# Patient Record
Sex: Male | Born: 1990 | Hispanic: Yes | Marital: Married | Smoking: Current some day smoker
Health system: Southern US, Community
[De-identification: ages and names within clinical notes are randomized; demographics above are authoritative.]

## PROBLEM LIST (undated history)

## (undated) DIAGNOSIS — G8929 Other chronic pain: Secondary | ICD-10-CM

## (undated) DIAGNOSIS — F419 Anxiety disorder, unspecified: Secondary | ICD-10-CM

## (undated) DIAGNOSIS — E079 Disorder of thyroid, unspecified: Secondary | ICD-10-CM

## (undated) DIAGNOSIS — K219 Gastro-esophageal reflux disease without esophagitis: Secondary | ICD-10-CM

## (undated) HISTORY — DX: Gastro-esophageal reflux disease without esophagitis: K21.9

---
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
## 2008-05-11 ENCOUNTER — Emergency Department: Payer: Self-pay | Admitting: Emergency Medicine

## 2008-05-12 ENCOUNTER — Emergency Department: Payer: Self-pay | Admitting: Emergency Medicine

## 2008-05-13 ENCOUNTER — Other Ambulatory Visit: Payer: Self-pay

## 2008-05-27 ENCOUNTER — Emergency Department: Payer: Self-pay | Admitting: Emergency Medicine

## 2008-11-20 ENCOUNTER — Emergency Department: Payer: Self-pay | Admitting: Emergency Medicine

## 2008-12-26 ENCOUNTER — Emergency Department: Payer: Self-pay | Admitting: Emergency Medicine

## 2009-02-08 ENCOUNTER — Emergency Department: Payer: Self-pay | Admitting: Emergency Medicine

## 2009-04-26 ENCOUNTER — Emergency Department: Payer: Self-pay | Admitting: Emergency Medicine

## 2009-12-22 ENCOUNTER — Emergency Department: Payer: Self-pay | Admitting: Emergency Medicine

## 2010-04-07 ENCOUNTER — Emergency Department: Payer: Self-pay | Admitting: Emergency Medicine

## 2010-05-30 ENCOUNTER — Emergency Department: Payer: Self-pay | Admitting: Emergency Medicine

## 2010-06-04 ENCOUNTER — Emergency Department: Payer: Self-pay | Admitting: Emergency Medicine

## 2010-06-13 ENCOUNTER — Emergency Department: Payer: Self-pay | Admitting: Emergency Medicine

## 2011-01-23 ENCOUNTER — Emergency Department: Payer: Self-pay | Admitting: Emergency Medicine

## 2011-03-08 ENCOUNTER — Emergency Department: Payer: Self-pay | Admitting: Emergency Medicine

## 2012-02-04 ENCOUNTER — Emergency Department: Payer: Self-pay | Admitting: Internal Medicine

## 2012-02-04 LAB — URINALYSIS, COMPLETE
Bacteria: NEGATIVE
Blood: NEGATIVE
Glucose,UR: NEGATIVE mg/dL (ref 0–75)
Ketone: NEGATIVE
Leukocyte Esterase: NEGATIVE
Ph: 5 (ref 4.5–8.0)
RBC,UR: NONE SEEN /HPF (ref 0–5)
Specific Gravity: 1.028 (ref 1.003–1.030)

## 2013-05-12 ENCOUNTER — Emergency Department: Payer: Self-pay | Admitting: Unknown Physician Specialty

## 2013-11-10 ENCOUNTER — Emergency Department: Payer: Self-pay | Admitting: Emergency Medicine

## 2013-11-10 LAB — URINALYSIS, COMPLETE
BACTERIA: NONE SEEN
BLOOD: NEGATIVE
Bilirubin,UR: NEGATIVE
GLUCOSE, UR: NEGATIVE mg/dL (ref 0–75)
Ketone: NEGATIVE
Leukocyte Esterase: NEGATIVE
Nitrite: NEGATIVE
Ph: 6 (ref 4.5–8.0)
Protein: NEGATIVE
Specific Gravity: 1.017 (ref 1.003–1.030)
Squamous Epithelial: 1

## 2013-11-10 LAB — GC/CHLAMYDIA PROBE AMP

## 2014-03-06 ENCOUNTER — Emergency Department: Payer: Self-pay | Admitting: Emergency Medicine

## 2014-05-26 ENCOUNTER — Emergency Department: Payer: Self-pay | Admitting: Emergency Medicine

## 2014-06-12 ENCOUNTER — Emergency Department: Payer: Self-pay | Admitting: Emergency Medicine

## 2014-11-13 ENCOUNTER — Emergency Department: Payer: Self-pay | Admitting: Emergency Medicine

## 2015-06-14 ENCOUNTER — Emergency Department
Admission: EM | Admit: 2015-06-14 | Discharge: 2015-06-15 | Disposition: A | Discharge status: Home / Self Care | Payer: No Typology Code available for payment source | Attending: Emergency Medicine | Admitting: Emergency Medicine

## 2015-06-14 ENCOUNTER — Encounter: Payer: Self-pay | Admitting: *Deleted

## 2015-06-14 ENCOUNTER — Emergency Department
Admission: EM | Admit: 2015-06-14 | Discharge: 2015-06-14 | Disposition: A | Discharge status: Home / Self Care | Payer: Self-pay

## 2015-06-14 DIAGNOSIS — Z720 Tobacco use: Secondary | ICD-10-CM | POA: Insufficient documentation

## 2015-06-14 DIAGNOSIS — K529 Noninfective gastroenteritis and colitis, unspecified: Secondary | ICD-10-CM | POA: Insufficient documentation

## 2015-06-14 DIAGNOSIS — R1031 Right lower quadrant pain: Secondary | ICD-10-CM | POA: Diagnosis present

## 2015-06-14 LAB — CBC
HEMATOCRIT: 42.5 % (ref 40.0–52.0)
HEMOGLOBIN: 14.5 g/dL (ref 13.0–18.0)
MCH: 30.5 pg (ref 26.0–34.0)
MCHC: 34 g/dL (ref 32.0–36.0)
MCV: 89.6 fL (ref 80.0–100.0)
Platelets: 193 10*3/uL (ref 150–440)
RBC: 4.75 MIL/uL (ref 4.40–5.90)
RDW: 13 % (ref 11.5–14.5)
WBC: 8.7 10*3/uL (ref 3.8–10.6)

## 2015-06-14 LAB — URINALYSIS COMPLETE WITH MICROSCOPIC (ARMC ONLY)
BACTERIA UA: NONE SEEN
Bilirubin Urine: NEGATIVE
Glucose, UA: NEGATIVE mg/dL
HGB URINE DIPSTICK: NEGATIVE
Ketones, ur: NEGATIVE mg/dL
LEUKOCYTES UA: NEGATIVE
NITRITE: NEGATIVE
PROTEIN: NEGATIVE mg/dL
SPECIFIC GRAVITY, URINE: 1.018 (ref 1.005–1.030)
pH: 6 (ref 5.0–8.0)

## 2015-06-14 LAB — COMPREHENSIVE METABOLIC PANEL
ALBUMIN: 5 g/dL (ref 3.5–5.0)
ALT: 20 U/L (ref 17–63)
ANION GAP: 9 (ref 5–15)
AST: 21 U/L (ref 15–41)
Alkaline Phosphatase: 78 U/L (ref 38–126)
BILIRUBIN TOTAL: 1.3 mg/dL — AB (ref 0.3–1.2)
BUN: 13 mg/dL (ref 6–20)
CHLORIDE: 101 mmol/L (ref 101–111)
CO2: 28 mmol/L (ref 22–32)
Calcium: 9.9 mg/dL (ref 8.9–10.3)
Creatinine, Ser: 0.92 mg/dL (ref 0.61–1.24)
GFR calc Af Amer: >60 mL/min (ref >60)
GFR calc non Af Amer: >60 mL/min (ref >60)
GLUCOSE: 109 mg/dL — AB (ref 65–99)
POTASSIUM: 4.1 mmol/L (ref 3.5–5.1)
Sodium: 138 mmol/L (ref 135–145)
TOTAL PROTEIN: 7.9 g/dL (ref 6.5–8.1)

## 2015-06-14 LAB — LIPASE, BLOOD: LIPASE: 20 U/L — AB (ref 22–51)

## 2015-06-14 MED ORDER — MORPHINE SULFATE (PF) 2 MG/ML IV SOLN
2.0000 mg | Freq: Once | INTRAVENOUS | Status: AC
  Administered 2015-06-14: 2 mg via INTRAVENOUS
  Filled 2015-06-14: qty 1

## 2015-06-14 MED ORDER — IOHEXOL 240 MG/ML SOLN
25.0000 mL | Freq: Once | INTRAMUSCULAR | Status: AC | PRN
  Administered 2015-06-14: 25 mL via ORAL

## 2015-06-14 MED ORDER — ONDANSETRON HCL 4 MG/2ML IJ SOLN
4.0000 mg | Freq: Once | INTRAMUSCULAR | Status: AC
  Administered 2015-06-14: 4 mg via INTRAVENOUS
  Filled 2015-06-14: qty 2

## 2015-06-14 NOTE — ED Notes (Signed)
Report given to Kim, RN.

## 2015-06-14 NOTE — ED Notes (Signed)
MD at bedside. 

## 2015-06-14 NOTE — ED Notes (Signed)
Pt reports "Sharp" epigastric pain since this morning, not relieved by multiple OTC meds. Pt reports several episodes of vomiting today.

## 2015-06-14 NOTE — ED Provider Notes (Signed)
[facility] Emergency Department Provider Note  ____________________________________________  Time seen: 10:45 PM  I have reviewed the triage vital signs and the nursing notes.   HISTORY  Chief Complaint Abdominal Pain     HPI Mitchell Burke is a 24 y.o. male presents with 7 out of 10 right lower quadrant/epigastric pain with onset this morning. Patient states chills however no fever. Patient denies any diarrhea.    Past medical history None There are no active problems to display for this patient.   Past surgical history None No current outpatient prescriptions on file.  Allergies No known drug allergies No family history on file.  Social History Social History  Substance Use Topics  . Smoking status: Current Every Day Smoker  . Smokeless tobacco: None  . Alcohol Use: No    Review of Systems  Constitutional: Negative for fever. Eyes: Negative for visual changes. ENT: Negative for sore throat. Cardiovascular: Negative for chest pain. Respiratory: Negative for shortness of breath. Gastrointestinal: positive for abdominal pain, vomiting. Genitourinary: Negative for dysuria. Musculoskeletal: Negative for back pain. Skin: Negative for rash. Neurological: Negative for headaches, focal weakness or numbness.   10-point ROS otherwise negative.  ____________________________________________   PHYSICAL EXAM:  VITAL SIGNS: ED Triage Vitals  Enc Vitals Group     BP 06/14/15 2005 136/81 mmHg     Pulse Rate 06/14/15 2005 72     Resp 06/14/15 2005 18     Temp 06/14/15 2005 98.3 F (36.8 C)     Temp Source 06/14/15 2005 Oral     SpO2 06/14/15 2005 100 %     Weight 06/14/15 2005 165 lb (74.844 kg)     Height 06/14/15 2005 6\' 1"  (1.854 m)     Head Cir --      Peak Flow --      Pain Score 06/14/15 2005 8     Pain Loc --      Pain Edu? --      Excl. in GC? --     Constitutional: Alert and oriented. Well appearing and in no  distress. Eyes: Conjunctivae are normal. PERRL. Normal extraocular movements. ENT   Head: Normocephalic and atraumatic.   Nose: No congestion/rhinnorhea.   Mouth/Throat: Mucous membranes are moist.   Neck: No stridor. Hematological/Lymphatic/Immunilogical: No cervical lymphadenopathy. Cardiovascular: Normal rate, regular rhythm. Normal and symmetric distal pulses are present in all extremities. No murmurs, rubs, or gallops. Respiratory: Normal respiratory effort without tachypnea nor retractions. Breath sounds are clear and equal bilaterally. No wheezes/rales/rhonchi. Gastrointestinal: right lower quadrant tenderness to palpationNo distention. There is no CVA tenderness. Genitourinary: deferred Musculoskeletal: Nontender with normal range of motion in all extremities. No joint effusions.  No lower extremity tenderness nor edema. Neurologic:  Normal speech and language. No gross focal neurologic deficits are appreciated. Speech is normal.  Skin:  Skin is warm, dry and intact. No rash noted. Psychiatric: Mood and affect are normal. Speech and behavior are normal. Patient exhibits appropriate insight and judgment.  ____________________________________________    LABS (pertinent positives/negatives)  Labs Reviewed  LIPASE, BLOOD - Abnormal; Notable for the following:    Lipase 20 (*)    All other components within normal limits  COMPREHENSIVE METABOLIC PANEL - Abnormal; Notable for the following:    Glucose, Bld 109 (*)    Total Bilirubin 1.3 (*)    All other components within normal limits  URINALYSIS COMPLETEWITH MICROSCOPIC ([facility] ONLY) - Abnormal; Notable for the following:    Color, Urine YELLOW (*)  APPearance CLEAR (*)    Squamous Epithelial / LPF 0-5 (*)    All other components within normal limits  CBC         RADIOLOGY  CT Abdomen Pelvis W Contrast (Final result) Result time: 06/15/15 01:01:26   Final result by Rad Results In Interface (06/15/15  01:01:26)   Narrative:   CLINICAL DATA: Epigastric pain with nausea and vomiting for 1 day. Right lower quadrant pain.  EXAM: CT ABDOMEN AND PELVIS WITH CONTRAST  TECHNIQUE: Multidetector CT imaging of the abdomen and pelvis was performed using the standard protocol following bolus administration of intravenous contrast.  CONTRAST: OMNIPAQUE IOHEXOL 300 MG/ML SOLN  COMPARISON: None.  FINDINGS: The included lung bases are clear.  The liver, gallbladder, spleen, pancreas, and adrenal glands are normal. The kidneys demonstrate symmetric enhancement without hydronephrosis or localizing abnormality.  Stomach is physiologically distended. There are no dilated or thickened small bowel loops. Minimal clavicle thickening of the ascending colon and hepatic flexure. No surrounding inflammatory change. Remainder the colon is normal.  The appendix is normal.  No retroperitoneal adenopathy. Abdominal aorta is normal in caliber. No free air, free fluid, or intra-abdominal fluid collection.  Bladder is physiologically distended. Prostate gland is normal in size. No pelvic adenopathy.  There are no acute or suspicious osseous abnormalities.  IMPRESSION: 1. Equivocal wall thickening involving the ascending colon hepatic flexure, may reflect minimal colitis. 2. Normal appendix. Otherwise no acute abnormality in the abdomen/pelvis.   Electronically Signed By: Rubye Oaks M.D. On: 06/15/2015 01:01        INITIAL IMPRESSION / ASSESSMENT AND PLAN / ED COURSE  Pertinent labs & imaging results that were available during my care of the patient were reviewed by me and considered in my medical decision making (see chart for details).  ----------------------------------------- 2:15 AM on 06/15/2015 -----------------------------------------  Patient states that he feels much better and is in agreement with discharge  plan.   ____________________________________________   FINAL CLINICAL IMPRESSION(S) / ED DIAGNOSES  Final diagnoses:  Colitis      Darci Current, MD 06/15/15 [phone]

## 2015-06-15 ENCOUNTER — Emergency Department: Payer: No Typology Code available for payment source

## 2015-06-15 ENCOUNTER — Encounter: Payer: Self-pay | Admitting: Radiology

## 2015-06-15 MED ORDER — IOHEXOL 300 MG/ML  SOLN
100.0000 mL | Freq: Once | INTRAMUSCULAR | Status: AC | PRN
  Administered 2015-06-15: 100 mL via INTRAVENOUS

## 2015-06-15 NOTE — Discharge Instructions (Signed)

## 2015-06-15 NOTE — ED Notes (Signed)
Patient transported to CT 

## 2016-01-21 ENCOUNTER — Emergency Department
Admission: EM | Admit: 2016-01-21 | Discharge: 2016-01-21 | Disposition: A | Discharge status: Home / Self Care | Payer: Managed Care, Other (non HMO) | Attending: Emergency Medicine | Admitting: Emergency Medicine

## 2016-01-21 DIAGNOSIS — F17200 Nicotine dependence, unspecified, uncomplicated: Secondary | ICD-10-CM | POA: Insufficient documentation

## 2016-01-21 DIAGNOSIS — T50991A Poisoning by other drugs, medicaments and biological substances, accidental (unintentional), initial encounter: Secondary | ICD-10-CM | POA: Diagnosis present

## 2016-01-21 DIAGNOSIS — T5991XA Toxic effect of unspecified gases, fumes and vapors, accidental (unintentional), initial encounter: Secondary | ICD-10-CM

## 2016-01-21 LAB — CARBOXYHEMOGLOBIN
Carboxyhemoglobin: 2.7 % (ref 1.5–9.0)
Methemoglobin: 0.9 %
O2 SAT: 71.9 %
Total oxygen content: 73.1 mL/dL

## 2016-01-21 NOTE — Discharge Instructions (Signed)
Chemical Inhalation Injury A chemical inhalation injury is an internal injury, such as lung damage, that results from breathing in fumes of a chemical or harmful substance (toxic agent). Chemical inhalation injuries most often occur:  During fires, when materials that are burned release chemicals into the environment.  During work accidents, when large quantities of toxic chemicals are spilled at [employer] or industrial sites. Chemical inhalation injuries vary in severity. An injury tends to be more severe:  The more acidic or alkaline the chemical is.  The more concentrated the substance is.  The longer you are exposed to the substance. RISK FACTORS You are at a high risk for a chemical inhalation injury if you:  Are exposed to burning materials.  Work with chemicals, solvents, or cleaners. SIGNS AND SYMPTOMS Symptoms of a chemical inhalation injury may include:  Hoarse voice.  Shortness of breath or trouble breathing.  Chest pain.  Pale or blue skin.  Mucus production.  Cough.  Weakness.  Dizziness or fainting. DIAGNOSIS Most chemical inhalation injuries can be diagnosed with a physical exam and medical history. Tests may be done to check for lung damage. They may include:  A blood oxygen level test.  A chest X-ray.  Pulmonary function tests. There are no tests to identify the specific chemical or substance that caused the injury. TREATMENT  There is no specific treatment for a chemical inhalation injury. Most treatment is directed at improving the ability of the lungs to deliver oxygen to the body. Time is needed for lung tissue to heal. Supportive treatment may include:  Aerosol treatments to decrease swelling in the airways.  Suctioning of the airways to remove excess mucus.  Supplemental oxygen. HOME CARE INSTRUCTIONS  Do not use any tobacco products, including cigarettes, chewing tobacco, or electronic cigarettes. If you need help quitting, ask your  health care provider.  Do not allow yourself to be exposed to any airway irritants, such as cigarette smoke or smoke from a fireplace.  Follow your health care provider's instructions for the use of any inhalers.  Take medicines only as directed by your health care provider.  Keep all follow-up visits as directed by your health care provider. This is important. SEEK MEDICAL CARE IF:  Your symptoms are not improving as your health care provider predicted. SEEK IMMEDIATE MEDICAL CARE IF:  Your symptoms get worse.  You have increasing shortness of breath or wheezing.  Your skin or your lips appear very pale or blue.  You have a persistent cough.  You cough up blood or dark material.  You have chest pain or weakness.  You have a fever.  You faint.   This information is not intended to replace advice given to you by your health care provider. Make sure you discuss any questions you have with your health care provider.   Document Released: 05/22/2014 Document Reviewed: 05/22/2014 Elsevier Interactive Patient Education 2016 [employer].  Poisoning Information, Adult Poisoning is illness caused by eating, drinking, touching, or inhaling a harmful substance. The damaging effects on the person's health will vary depending on the type of poison, the amount of exposure, and the duration of exposure before treatment. These effects may range from mild to very severe or even fatal.  Most poisonings take place in the home and involve common household products. They can also occur in the workplace, especially in industrial or manufacturing facilities. Poisoning is more common in children than adults. However, poisoning often causes more serious illness in adults. Poisonings are often accidental,  but there are also many cases in which a person intentionally ingests poison. WHAT THINGS MAY BE POISONOUS?  A poison can be any substance that causes illness or harm to the body. Poisoning is often  caused by products that are commonly found in homes. Many substances can become poisonous if used in ways or amounts that are not appropriate. Some common products that can cause poisoning are:   Medicines, including prescription medicines, over-the-counter pain medicines, vitamins, iron pills, and herbal supplements.  Cleaning or laundry products.  Paint and paint thinner.  Weed or insect killers.  Perfume, hair spray, or nail products.  Alcohol.  Plants, such as philodendron, poinsettia, oleander, castor bean, cactus, and tomato plants.  Batteries.  Furniture polish.  Drain cleaners.  Antifreeze or other automotive products.  Gasoline, lighter fluid, or lamp oil.  Carbon monoxide gas from furnaces or automobiles.  Toxic fumes from the burning of plastics or certain other materials. WHAT ARE SOME FIRST-AID MEASURES FOR POISONING? The local poison control center must be contacted whenever a person may have been exposed to poison. The poison control specialist will often give a set of directions to follow over the phone. These directions may include the following:  Remove any substance that is still in the mouth if the poison was not food or medicine. Drink a small amount of water.  Keep the medicine container if too much medicine or the wrong medicine was swallowed. Use it to identify the medicine to the poison control specialist.  Get away from the area where exposure occurred as soon as possible if the poison was from fumes or chemicals.  Get fresh air as soon as possible if a poison was inhaled.  Remove any affected clothing and rinse the skin with water if a poison got on the skin.  Rinse the eyes with water if a poison or chemical got in the eyes.  Begin cardiopulmonary resuscitation (CPR) if breathing stops. HOW CAN YOU PREVENT POISONING? Take these steps to help prevent poisoning:  Keep medicines and chemical products in their original containers. Many of  these come in child-safe packaging. Store them in areas out of reach of children.  Educate othersabout the dangers of possible poisons.  Read labels before using medicine or household products. Leave the original labels on the containers.  Always turn on a light when taking medicine. Check the dosage every time.   Close the containers tightly after using medicine or chemical products.  Get rid of unneeded and outdated medicines by following the specific disposal instructions on the medicine label or the patient information that came with the medicine. Do not put medicine in the trash or flush it down the toilet. Use the community's drug take-back program to dispose of medicine. If these options are not available, take the medicine out of the original container and mix it with an undesirable substance, such as coffee grounds or kitty litter. Seal the mixture in a sealable bag, can, or other container and throw it away.  Keep all dangerous household products (such as lighter fluid, paint thinner and remover, gasoline, and antifreeze) in locked cabinets.  Do not mix different household chemicals with each other.  Use protective equipment (gloves, goggles, masks, aprons) as needed when using chemicals or cleaners.  Install a carbon monoxide detector in your home. WHEN SHOULD YOU SEEK HELP?  Contact the poison control center wheneveryou suspect that a person has been exposed to poison. Call [phone] [phone] (in the [place] to reach a poison center  for your area. If you are outside the [place] ask your health care provider what the phone number is for your local poison control center. Keep the phone number posted near your phone. Make sure everyone in your household knows where to find the number. The local emergency services (911 in [place] must be contacted if a person has been exposed to poison and:   Has trouble breathing or stops breathing.  Develops chest pain.  Has trouble staying awake or  becomes unconscious.  Has a seizure.  Has severe vomiting or bleeding.  Has a worsening headache.  Has a decreased level of alertness.  Develops a widespread rash that may or may not be painful.  Has changes in vision.  Has difficulty swallowing.  Develops severe abdominal pain. FOR MORE INFORMATION  American Association of Poison Control Centers: www.aapcc.org   This information is not intended to replace advice given to you by your health care provider. Make sure you discuss any questions you have with your health care provider.   Document Released: 09/04/2012 Document Revised: 02/02/2015 Document Reviewed: 09/04/2012 Elsevier Interactive Patient Education [employer] Elsevier Inc.

## 2016-01-21 NOTE — ED Provider Notes (Signed)
[facility] Emergency Department Provider Note  ____________________________________________  Time seen: 5:20 PM  I have reviewed the triage vital signs and the nursing notes.   HISTORY  Chief Complaint Toxic Inhalation    HPI Mitchell Burke is a 25 y.o. 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 He believes it was a carbon dioxide canister. Afterward he felt somewhat short of breath chest tightness with headache. He presents to the symptoms have all now resolved but he just feels fatigued. No other complaints. Denies any medical history.     No past medical history on file.  None There are no active problems to display for this patient.    No past surgical history on file. None  No current outpatient prescriptions on file. None  Allergies Review of patient's allergies indicates no known allergies.   No family history on file.  Social History Social History  Substance Use Topics  . Smoking status: Current Every Day Smoker  . Smokeless tobacco: Not on file  . Alcohol Use: No    Review of Systems  Constitutional:   No fever or chills.  Eyes:   No vision changes.  ENT:   No sore throat. No rhinorrhea. Cardiovascular:   Brief episode as above of chest pain. Respiratory:   Episode of shortness of breath as above now resolved. Gastrointestinal:   Negative for abdominal pain, vomiting and diarrhea.  No bloody stool. Genitourinary:   Negative for dysuria or difficulty urinating. Musculoskeletal:   Negative for focal pain or swelling Neurological:   Positive for frontal headaches 10-point ROS otherwise negative.  ____________________________________________   PHYSICAL EXAM:  VITAL SIGNS: ED Triage Vitals  Enc Vitals Group     BP --      Pulse Rate 01/21/16 1516 70     Resp 01/21/16 1516 16     Temp 01/21/16 1516 99 F (37.2 C)     Temp Source 01/21/16 1516 Oral      SpO2 01/21/16 1516 100 %     Weight 01/21/16 1516 176 lb (79.833 kg)     Height 01/21/16 1516  (1.854 m)     Head Cir --      Peak Flow --      Pain Score --      Pain Loc --      Pain Edu? --      Excl. in GC? --     Vital signs reviewed, nursing assessments reviewed.   Constitutional:   Alert and oriented. Well appearing and in no distress. Eyes:   No scleral icterus. No conjunctival pallor. PERRL. EOMI ENT   Head:   Normocephalic and atraumatic.   Nose:   No congestion/rhinnorhea. No septal hematoma   Mouth/Throat:   MMM, no pharyngeal erythema. No peritonsillar mass.    Neck:   No stridor. No SubQ emphysema. No meningismus. Hematological/Lymphatic/Immunilogical:   No cervical lymphadenopathy. Cardiovascular:   RRR. Symmetric bilateral radial and DP pulses.  No murmurs.  Respiratory:   Normal respiratory effort without tachypnea nor retractions. Breath sounds are clear and equal bilaterally. No wheezes/rales/rhonchi. Gastrointestinal:   Soft and nontender. Non distended. There is no CVA tenderness.  No rebound, rigidity, or guarding. Neurologic:   Normal speech and language.  CN 2-10 normal. Motor grossly intact. Normal gait No gross focal neurologic deficits are appreciated.   ____________________________________________    LABS (pertinent positives/negatives) (all labs ordered  are listed, but only abnormal results are displayed) Labs Reviewed  CARBOXYHEMOGLOBIN   ____________________________________________   EKG  Interpreted by me  Date: 01/21/2016  Rate: 72  Rhythm: normal sinus rhythm  QRS Axis: normal  Intervals: normal  ST/T Wave abnormalities: normal  Conduction Disutrbances: none  Narrative Interpretation: unremarkable      ____________________________________________    RADIOLOGY    ____________________________________________   PROCEDURES   ____________________________________________   INITIAL IMPRESSION /  ASSESSMENT AND PLAN / ED COURSE  Pertinent labs & imaging results that were available during my care of the patient were reviewed by me and considered in my medical decision making (see chart for details).  Patient well appearing no acute distress. Currently asymptomatic except for feeling fatigued. Carboxyhemoglobin level is unremarkable. Low suspicion for other toxic inhalation. No evidence of pneumonitis or respiratory distress on exam. We'll discharge home, follow up with primary care.     ____________________________________________   FINAL CLINICAL IMPRESSION(S) / ED DIAGNOSES  Final diagnoses:  Inhalation of gaseous substance, initial encounter       Portions of this note were generated with dragon dictation software. Dictation errors may occur despite best attempts at proofreading.   Sharman CheekPhillip Miliani Deike, MD 01/21/16 [phone]

## 2016-01-21 NOTE — ED Notes (Signed)
Pt reports being exposed to carbon monoxide for 30 mins at work. Reports feeling dizziness and chest tightness.

## 2016-01-21 NOTE — ED Notes (Signed)
Pt states that he was exposed to carbon dioxide at work. PT reports SOB, chest tightness and watering eyes PTA. No sx at this time. Pt alert and oriented X4, active, cooperative, pt in NAD. RR even and unlabored, color WNL.

## 2016-01-21 NOTE — ED Notes (Signed)
MD at bedside. 

## 2016-07-19 ENCOUNTER — Emergency Department
Admission: EM | Admit: 2016-07-19 | Discharge: 2016-07-19 | Disposition: A | Discharge status: Home / Self Care | Payer: Managed Care, Other (non HMO) | Attending: Emergency Medicine | Admitting: Emergency Medicine

## 2016-07-19 ENCOUNTER — Emergency Department: Payer: Managed Care, Other (non HMO)

## 2016-07-19 ENCOUNTER — Encounter: Payer: Self-pay | Admitting: Medical Oncology

## 2016-07-19 DIAGNOSIS — R110 Nausea: Secondary | ICD-10-CM | POA: Insufficient documentation

## 2016-07-19 DIAGNOSIS — F17200 Nicotine dependence, unspecified, uncomplicated: Secondary | ICD-10-CM | POA: Insufficient documentation

## 2016-07-19 DIAGNOSIS — R42 Dizziness and giddiness: Secondary | ICD-10-CM | POA: Insufficient documentation

## 2016-07-19 DIAGNOSIS — Z5321 Procedure and treatment not carried out due to patient leaving prior to being seen by health care provider: Secondary | ICD-10-CM | POA: Insufficient documentation

## 2016-07-19 DIAGNOSIS — R0789 Other chest pain: Secondary | ICD-10-CM | POA: Diagnosis present

## 2016-07-19 HISTORY — DX: Anxiety disorder, unspecified: F41.9

## 2016-07-19 LAB — BASIC METABOLIC PANEL
ANION GAP: 10 (ref 5–15)
BUN: 12 mg/dL (ref 6–20)
CHLORIDE: 101 mmol/L (ref 101–111)
CO2: 27 mmol/L (ref 22–32)
Calcium: 9.6 mg/dL (ref 8.9–10.3)
Creatinine, Ser: 0.93 mg/dL (ref 0.61–1.24)
GFR calc Af Amer: >60 mL/min (ref >60)
GFR calc non Af Amer: >60 mL/min (ref >60)
GLUCOSE: 95 mg/dL (ref 65–99)
POTASSIUM: 3.9 mmol/L (ref 3.5–5.1)
Sodium: 138 mmol/L (ref 135–145)

## 2016-07-19 LAB — CBC
HEMATOCRIT: 45.5 % (ref 40.0–52.0)
HEMOGLOBIN: 15.5 g/dL (ref 13.0–18.0)
MCH: 30.3 pg (ref 26.0–34.0)
MCHC: 34 g/dL (ref 32.0–36.0)
MCV: 89.2 fL (ref 80.0–100.0)
Platelets: 216 10*3/uL (ref 150–440)
RBC: 5.1 MIL/uL (ref 4.40–5.90)
RDW: 13.7 % (ref 11.5–14.5)
WBC: 8.6 10*3/uL (ref 3.8–10.6)

## 2016-07-19 LAB — TROPONIN I: Troponin I: <0.03 ng/mL (ref <0.03)

## 2016-07-19 NOTE — ED Triage Notes (Signed)
Pt reports that he suddenly this am had dizziness, hot all over and nausea, since then has continued to have pressure to the center of his chest. Pt reports hx of anxiety attacks years ago and this feels similar.

## 2016-08-14 ENCOUNTER — Emergency Department: Payer: Managed Care, Other (non HMO)

## 2016-08-14 ENCOUNTER — Emergency Department
Admission: EM | Admit: 2016-08-14 | Discharge: 2016-08-14 | Disposition: A | Discharge status: Home / Self Care | Payer: Managed Care, Other (non HMO) | Attending: Emergency Medicine | Admitting: Emergency Medicine

## 2016-08-14 ENCOUNTER — Encounter: Payer: Self-pay | Admitting: Emergency Medicine

## 2016-08-14 DIAGNOSIS — F17200 Nicotine dependence, unspecified, uncomplicated: Secondary | ICD-10-CM | POA: Diagnosis not present

## 2016-08-14 DIAGNOSIS — T7840XA Allergy, unspecified, initial encounter: Secondary | ICD-10-CM | POA: Insufficient documentation

## 2016-08-14 MED ORDER — METHYLPREDNISOLONE SODIUM SUCC 125 MG IJ SOLR
125.0000 mg | Freq: Once | INTRAMUSCULAR | Status: AC
  Administered 2016-08-14: 125 mg via INTRAVENOUS
  Filled 2016-08-14: qty 2

## 2016-08-14 MED ORDER — PREDNISONE 20 MG PO TABS
60.0000 mg | ORAL_TABLET | Freq: Once | ORAL | Status: AC
  Administered 2016-08-14: 60 mg via ORAL
  Filled 2016-08-14: qty 3

## 2016-08-14 MED ORDER — FAMOTIDINE IN NACL 20-0.9 MG/50ML-% IV SOLN
20.0000 mg | Freq: Once | INTRAVENOUS | Status: AC
Start: 2016-08-14 — End: 2016-08-14
  Administered 2016-08-14: 20 mg via INTRAVENOUS
  Filled 2016-08-14: qty 50

## 2016-08-14 MED ORDER — DIPHENHYDRAMINE HCL 50 MG/ML IJ SOLN
25.0000 mg | Freq: Once | INTRAMUSCULAR | Status: AC
  Administered 2016-08-14: 25 mg via INTRAVENOUS
  Filled 2016-08-14: qty 1

## 2016-08-14 NOTE — ED Notes (Signed)
Pt denies any difficulty swallowing or swelling in throat - pt is able to speak clearly and is not having any trouble clearing his throat - respirations are even and unlabored at this time - no rash is observed on body

## 2016-08-14 NOTE — ED Provider Notes (Signed)
[facility] Emergency Department Provider Note  ____________________________________________   First MD Initiated Contact with Patient 08/14/16 [phone]     (approximate)  I have reviewed the triage vital signs and the nursing notes.   HISTORY  Chief Complaint Allergic Reaction    HPI Mitchell Burke is a 25 y.o. male patient reports she had just finished drinking his coffee and then felt something like air or some funny feeling coming up at his chest into his throat and now his throat seems like there is something hung up in it and he can't clear it. Almost feels like it is closing. Patient's voice is unchanged patient is having no trouble breathing patient had an allergic reaction before with hives and itching is not having any of that now. Patient does keep clearing his throat very often patient denies any medical problems he is not short of breath is not running a fever.   Past Medical History:  Diagnosis Date  . Anxiety     There are no active problems to display for this patient.   History reviewed. No pertinent surgical history.  Prior to Admission medications   Not on File    Allergies Patient has no known allergies.  History reviewed. No pertinent family history.  Social History Social History  Substance Use Topics  . Smoking status: Current Every Day Smoker  . Smokeless tobacco: Not on file  . Alcohol use No    Review of Systems Constitutional: No fever/chills Eyes: No visual changes. ENT: No sore throat. Cardiovascular: Denies chest pain. Respiratory: Denies shortness of breath. Gastrointestinal: No abdominal pain.  No nausea, no vomiting.  No diarrhea.  No constipation. Genitourinary: Negative for dysuria. Musculoskeletal: Negative for back pain. Skin: Negative for rash. Neurological: Negative for headaches, focal weakness or numbness.  10-point ROS otherwise  negative.  ____________________________________________   PHYSICAL EXAM:  VITAL SIGNS: ED Triage Vitals [08/14/16 0839]  Enc Vitals Group     BP 126/81     Pulse Rate 63     Resp 18     Temp 98.1 F (36.7 C)     Temp Source Oral     SpO2 100 %     Weight      Height      Head Circumference      Peak Flow      Pain Score      Pain Loc      Pain Edu?      Excl. in [place]?     Constitutional: Alert and oriented. Well appearing and in no acute distress. Eyes: Conjunctivae are normal. PERRL. EOMI. Head: Atraumatic. Nose: No congestion/rhinnorhea. Mouth/Throat: Mucous membranes are moist.  Oropharynx non-erythematous. Neck: No stridor.  Cardiovascular: Normal rate, regular rhythm. Grossly normal heart sounds.  Good peripheral circulation. Respiratory: Normal respiratory effort.  No retractions. Lungs CTAB. Gastrointestinal: Soft and nontender. No distention. No abdominal bruits. No CVA tenderness. Musculoskeletal: No lower extremity tenderness nor edema.  No joint effusions. Neurologic:  Normal speech and language. No gross focal neurologic deficits are appreciated. No gait instability. Skin:  Skin is warm, dry and intact. No rash noted. Psychiatric: Mood and affect are normal. Speech Is normal.  ____________________________________________   LABS (all labs ordered are listed, but only abnormal results are displayed)  Labs Reviewed - No data to display ____________________________________________  EKG  EKG read and interpreted by me shows normal sinus rhythm rate of 69 normal axis and ST elevation in the chest leads most  likely early repolarization. The patient has absolutely no chest discomfort at all. ____________________________________________  RADIOLOGY  Study Result   CLINICAL DATA:  Pt stated that he was drinking coffee this morning AND throat felt like it was closing up. Pt has a hx of hives but doesn't know what caused it. No hx of heart/lung  conditions.  EXAM: CHEST - 2 VIEW  COMPARISON:  07/19/2016  FINDINGS: Lungs are clear.  Heart size and mediastinal contours are within normal limits.  No effusion.  Visualized bones unremarkable.  IMPRESSION: No acute cardiopulmonary disease.   Electronically Signed   By: Corlis Leak  Hassell M.D.   On: 08/14/2016 09:54    ____________________________________________   PROCEDURES  Procedure(s) performed:   Procedures  Critical Care performed:   ____________________________________________   INITIAL IMPRESSION / ASSESSMENT AND PLAN / ED COURSE  Pertinent labs & imaging results that were available during my care of the patient were reviewed by me and considered in my medical decision making (see chart for details).    Clinical Course    Patient feels much better. I will discharge him. Give him 1 more dose of prednisone now. I'm not sure that this was an allergic reaction.  ____________________________________________   FINAL CLINICAL IMPRESSION(S) / ED DIAGNOSES  Final diagnoses:  Allergic reaction, initial encounter   Actual diagnosis possible allergic reaction   NEW MEDICATIONS STARTED DURING THIS VISIT:  New Prescriptions   No medications on file     Note:  This document was prepared using Dragon voice recognition software and may include unintentional dictation errors.    [name], MD 08/14/16 1332

## 2016-08-14 NOTE — ED Notes (Signed)
Patient was driving and this started out of no where. Throat feels like something is stuck in throat. Is clearing throat. Voice is clear. Mucous membranes pink and wet. Able to cough.

## 2016-08-14 NOTE — ED Notes (Signed)
Patient reports that throat is better. Did not clear throat while in room.

## 2016-08-14 NOTE — ED Notes (Signed)
Patient states that about 3 year ago he had an allergic reaction that resulted in hives and breaking out. Patient does not have any visible hives on his upper trunk, arms, neck or face. Patient states all he has had this morning is coffee when asked if he ate something he was allergic too. Patient was given an epi pen 3 years ago but did not refill it.

## 2016-08-14 NOTE — Discharge Instructions (Signed)
Please return for any further symptoms or problems. She gets short of breath: 911. They can start treating immediately.

## 2016-08-14 NOTE — ED Notes (Signed)
Patient returned from x-ray placed back on monitor. Patient states he throat feels a little better. Did clear his throat when I asked him the question, did not prior to me asking. Will continue to monitor.

## 2016-08-14 NOTE — ED Triage Notes (Signed)
Reports feeling like throat is closing.  No resp distress.  Pt clearing throat often.  Skin w/d.

## 2019-08-24 ENCOUNTER — Other Ambulatory Visit: Payer: Self-pay

## 2019-08-24 ENCOUNTER — Emergency Department: Payer: No Typology Code available for payment source

## 2019-08-24 ENCOUNTER — Encounter: Payer: Self-pay | Admitting: *Deleted

## 2019-08-24 DIAGNOSIS — R109 Unspecified abdominal pain: Secondary | ICD-10-CM | POA: Insufficient documentation

## 2019-08-24 DIAGNOSIS — F17210 Nicotine dependence, cigarettes, uncomplicated: Secondary | ICD-10-CM | POA: Insufficient documentation

## 2019-08-24 DIAGNOSIS — M549 Dorsalgia, unspecified: Secondary | ICD-10-CM | POA: Insufficient documentation

## 2019-08-24 LAB — URINALYSIS, COMPLETE (UACMP) WITH MICROSCOPIC
Bacteria, UA: NONE SEEN
Bilirubin Urine: NEGATIVE
Glucose, UA: NEGATIVE mg/dL
Hgb urine dipstick: NEGATIVE
Ketones, ur: NEGATIVE mg/dL
Leukocytes,Ua: NEGATIVE
Nitrite: NEGATIVE
Protein, ur: NEGATIVE mg/dL
Specific Gravity, Urine: 1.003 — ABNORMAL LOW (ref 1.005–1.030)
pH: 6 (ref 5.0–8.0)

## 2019-08-24 LAB — COMPREHENSIVE METABOLIC PANEL
ALT: 31 U/L (ref 0–44)
AST: 26 U/L (ref 15–41)
Albumin: 4.7 g/dL (ref 3.5–5.0)
Alkaline Phosphatase: 78 U/L (ref 38–126)
Anion gap: 8 (ref 5–15)
BUN: 16 mg/dL (ref 6–20)
CO2: 28 mmol/L (ref 22–32)
Calcium: 9.6 mg/dL (ref 8.9–10.3)
Chloride: 104 mmol/L (ref 98–111)
Creatinine, Ser: 0.99 mg/dL (ref 0.61–1.24)
GFR calc Af Amer: >60 mL/min (ref >60)
GFR calc non Af Amer: >60 mL/min (ref >60)
Glucose, Bld: 159 mg/dL — ABNORMAL HIGH (ref 70–99)
Potassium: 3.6 mmol/L (ref 3.5–5.1)
Sodium: 140 mmol/L (ref 135–145)
Total Bilirubin: 0.8 mg/dL (ref 0.3–1.2)
Total Protein: 8 g/dL (ref 6.5–8.1)

## 2019-08-24 LAB — CBC
HCT: 43 % (ref 39.0–52.0)
Hemoglobin: 14.5 g/dL (ref 13.0–17.0)
MCH: 29.7 pg (ref 26.0–34.0)
MCHC: 33.7 g/dL (ref 30.0–36.0)
MCV: 87.9 fL (ref 80.0–100.0)
Platelets: 229 K/uL (ref 150–400)
RBC: 4.89 MIL/uL (ref 4.22–5.81)
RDW: 12.6 % (ref 11.5–15.5)
WBC: 7.4 K/uL (ref 4.0–10.5)
nRBC: 0 % (ref 0.0–0.2)

## 2019-08-24 LAB — LIPASE, BLOOD: Lipase: 37 U/L (ref 11–51)

## 2019-08-24 MED ORDER — SODIUM CHLORIDE 0.9% FLUSH: 3.0000 mL | Freq: Once | INTRAVENOUS | Status: DC

## 2019-08-24 NOTE — ED Triage Notes (Signed)
Pt has low back pain and right side abd pain.  Pt denies n/v/d.  Sx for 4 days   No hx kidney stones.  No injury to back   Pt alert

## 2019-08-25 ENCOUNTER — Encounter: Payer: Self-pay | Admitting: Emergency Medicine

## 2019-08-25 ENCOUNTER — Emergency Department
Admission: EM | Admit: 2019-08-25 | Discharge: 2019-08-25 | Disposition: A | Discharge status: Home / Self Care | Payer: No Typology Code available for payment source | Attending: Emergency Medicine | Admitting: Emergency Medicine

## 2019-08-25 DIAGNOSIS — R109 Unspecified abdominal pain: Secondary | ICD-10-CM

## 2019-08-25 HISTORY — DX: Other chronic pain: G89.29

## 2019-08-25 MED ORDER — IOHEXOL 300 MG/ML  SOLN
100.0000 mL | Freq: Once | INTRAMUSCULAR | Status: AC | PRN
  Administered 2019-08-25: 100 mL via INTRAVENOUS

## 2019-08-25 NOTE — ED Provider Notes (Signed)
[facility] Emergency Department Provider Note  ____________________________________________   First MD Initiated Contact with Patient 08/25/19 0403     (approximate)  I have reviewed the triage vital signs and the nursing notes.   HISTORY  Chief Complaint Abdominal Pain and Back Pain    HPI Lymon Kidney is a 28 y.o. male who is otherwise healthy with medical history as listed below who presents for evaluation of about 4 days of intermittent sharp pain in the right lower part of his abdomen that radiates down into the groin.   Nothing in particular makes it better or worse. He has no pain when he urinates and has not seen blood in his urine. He denies fever/chills, sore throat, chest pain, shortness of breath, cough, nausea, vomiting, and diarrhea. Because of the location he was concerned it might be his appendix so he came in. He also thought he felt some swelling in the right lower part of his abdomen, specifically a "pooching" above his belt line on the right side. He describes the pain as anywhere from mild to moderate. He has no history of kidney stones.        Past Medical History:  Diagnosis Date   Anxiety    Chronic pain    Suboxone used, prescribed by MD in [facility]    There are no active problems to display for this patient.   History reviewed. No pertinent surgical history.  Prior to Admission medications   Not on File    Allergies Patient has no known allergies.  History reviewed. No pertinent family history.  Social History Social History   Tobacco Use   Smoking status: Current Every Day Smoker   Smokeless tobacco: Never Used  Substance Use Topics   Alcohol use: No   Drug use: No    Comment: uses Suboxone    Review of Systems Constitutional: No fever/chills Eyes: No visual changes. ENT: No sore throat. Cardiovascular: Denies chest pain. Respiratory: Denies shortness of breath. Gastrointestinal:  Intermittent left lower quadrant pain radiating down into his groin for 4 days. No nausea, vomiting, nor diarrhea. Genitourinary: Negative for dysuria. Negative for hematuria. Musculoskeletal: Negative for neck pain.  Negative for back pain. Integumentary: Negative for rash. Neurological: Negative for headaches, focal weakness or numbness.   ____________________________________________   PHYSICAL EXAM:  VITAL SIGNS: ED Triage Vitals  Enc Vitals Group     BP 08/24/19 1838 (!) 142/76     Pulse Rate 08/24/19 1838 80     Resp 08/24/19 1838 17     Temp 08/24/19 1838 98.8 F (37.1 C)     Temp Source 08/24/19 1838 Oral     SpO2 08/24/19 1838 94 %     Weight 08/24/19 1839 86.2 kg (190 lb)     Height 08/24/19 1839 1.803 m (5\' 11" )     Head Circumference --      Peak Flow --      Pain Score 08/24/19 1853 6     Pain Loc --      Pain Edu? --      Excl. in [place]? --     Constitutional: Alert and oriented. Well-appearing and in no distress. Healthy body habitus. Eyes: Conjunctivae are normal.  Head: Atraumatic. Nose: No congestion/rhinnorhea. Mouth/Throat: Patient is wearing a mask. Neck: No stridor.  No meningeal signs.   Cardiovascular: Normal rate, regular rhythm. Good peripheral circulation. Grossly normal heart sounds. Respiratory: Normal respiratory effort.  No retractions. Gastrointestinal: Soft and nontender. No  distention. No tenderness at McBurney's point, negative Murphy sign. No rebound and no guarding. Genitourinary: No testicular pain or swelling, no inguinal swelling or tenderness to palpation. Musculoskeletal: No lower extremity tenderness nor edema. No gross deformities of extremities. Neurologic:  Normal speech and language. No gross focal neurologic deficits are appreciated.  Skin:  Skin is warm, dry and intact. Psychiatric: Mood and affect are normal. Speech and behavior are normal.  ____________________________________________   LABS (all labs ordered are listed,  but only abnormal results are displayed)  Labs Reviewed  COMPREHENSIVE METABOLIC PANEL - Abnormal; Notable for the following components:      Result Value   Glucose, Bld 159 (*)    All other components within normal limits  URINALYSIS, COMPLETE (UACMP) WITH MICROSCOPIC - Abnormal; Notable for the following components:   Color, Urine COLORLESS (*)    APPearance CLEAR (*)    Specific Gravity, Urine 1.003 (*)    All other components within normal limits  LIPASE, BLOOD  CBC   ____________________________________________  EKG  No indication for EKG ____________________________________________  RADIOLOGY I, Loleta Roseory Talor Desrosiers, personally viewed and evaluated these images (plain radiographs) as part of my medical decision making, as well as reviewing the written report by the radiologist.  ED MD interpretation: Normal CT scan of the abdomen and pelvis  Official radiology report(s): Ct Abd/pelvis W/ Iv Contrast  Result Date: 08/25/2019 CLINICAL DATA:  Nausea and vomiting with increased urinary frequency. Back pain for 4-5 days. EXAM: CT ABDOMEN AND PELVIS WITH CONTRAST TECHNIQUE: Multidetector CT imaging of the abdomen and pelvis was performed using the standard protocol following bolus administration of intravenous contrast. CONTRAST:  100mL OMNIPAQUE IOHEXOL 300 MG/ML  SOLN COMPARISON:  06/15/2015 FINDINGS: LOWER CHEST: No basilar pleural or apical pericardial effusion. HEPATOBILIARY: Normal hepatic contours. There is no intra- or extrahepatic biliary dilatation. The gallbladder is normal. PANCREAS: Normal pancreatic contours without pancreatic ductal dilatation or peripancreatic fluid collection. SPLEEN: Normal. ADRENALS/URINARY TRACT: --Adrenal glands: Normal. --Right kidney/ureter: No hydronephrosis, nephroureterolithiasis or solid renal mass. --Left kidney/ureter: No hydronephrosis, nephroureterolithiasis or solid renal mass. --Urinary bladder: Normal for degree of distention STOMACH/BOWEL:  --Stomach/Duodenum: There is no hiatal hernia. The duodenal course and caliber are normal. --Small bowel: No dilatation or inflammation. --Colon: No focal abnormality. --Appendix: Normal. VASCULAR/LYMPHATIC: Normal course and caliber of the major abdominal vessels. No abdominal or pelvic lymphadenopathy. REPRODUCTIVE: Normal prostate size with symmetric seminal vesicles. MUSCULOSKELETAL. No bony spinal canal stenosis or focal osseous abnormality. OTHER: None. IMPRESSION: No acute abdominal or pelvic abnormality. Electronically Signed   By: Deatra RobinsonKevin  Herman M.D.   On: 08/25/2019 00:17    ____________________________________________   PROCEDURES   Procedure(s) performed (including Critical Care):  Procedures   ____________________________________________   INITIAL IMPRESSION / MDM / ASSESSMENT AND PLAN / ED COURSE  As part of my medical decision making, I reviewed the following data within the electronic MEDICAL RECORD NUMBER Nursing notes reviewed and incorporated, Labs reviewed , Old chart reviewed, Notes from prior ED visits and [place] Controlled Substance Database   Differential diagnosis includes, but is not limited to, musculoskeletal strain, ureteral colic, inguinal hernia, epiploic appendagitis, mesenteric adenitis, appendicitis, diverticulitis. Patient is well-appearing in no distress, ambulating briskly and in no apparent pain. Vital signs are stable. Lab work is all within normal limits including blood work and urinalysis. No leukocytosis, normal lipase, CT scan without any evidence of acute abnormality. Patient waited just under 10 hours in the lobby but was asymptomatic when I saw him and seemed reassured  when I told him the results. He is comfortable with the plan for discharge and outpatient follow-up. I verified with him that he is already taking a PPI, ibuprofen, Tylenol, and his prescribed Suboxone and I encouraged him to continue taking his regular medications. I gave my usual and  customary return precautions.          ____________________________________________  FINAL CLINICAL IMPRESSION(S) / ED DIAGNOSES  Final diagnoses:  Right sided abdominal pain     MEDICATIONS GIVEN DURING THIS VISIT:  Medications  sodium chloride flush (NS) 0.9 % injection 3 mL (has no administration in time range)  iohexol (OMNIPAQUE) 300 MG/ML solution 100 mL (100 mLs Intravenous Contrast Given 08/25/19 0003)     ED Discharge Orders    None      *Please note:  Mitchell Burke was evaluated in Emergency Department on 08/25/2019 for the symptoms described in the history of present illness. He was evaluated in the context of the global COVID-19 pandemic, which necessitated consideration that the patient might be at risk for infection with the SARS-CoV-2 virus that causes COVID-19. Institutional protocols and algorithms that pertain to the evaluation of patients at risk for COVID-19 are in a state of rapid change based on information released by regulatory bodies including the CDC and federal and state organizations. These policies and algorithms were followed during the patient's care in the ED.  Some ED evaluations and interventions may be delayed as a result of limited staffing during the pandemic.*  Note:  This document was prepared using Dragon voice recognition software and may include unintentional dictation errors.   Loleta Rose, MD 08/25/19 [phone]

## 2019-08-25 NOTE — ED Notes (Signed)
Peripheral IV discontinued. Catheter intact. No signs of infiltration or redness. Gauze applied to IV site.   Discharge instructions reviewed with patient. Questions fielded by this RN. Patient verbalizes understanding of instructions. Patient discharged home in stable condition per forbach. No acute distress noted at time of discharge.    

## 2019-08-25 NOTE — ED Notes (Addendum)
Pt reports pain in right flank that moved towards RLQ and radiating into right groin over the last 4 days- no pain to palpation or gross swelling, pt denies hx of kidney stone but reports family hx of same  Pt denies N/V or D or hx of abdominal surgeries, c/o feeling of having "to pee after peeing"

## 2019-08-25 NOTE — Discharge Instructions (Signed)

## 2019-09-22 ENCOUNTER — Ambulatory Visit: Payer: 59 | Admitting: Occupational Medicine

## 2019-09-22 ENCOUNTER — Other Ambulatory Visit: Payer: Self-pay

## 2019-09-22 ENCOUNTER — Encounter: Payer: Self-pay | Admitting: Occupational Medicine

## 2019-09-22 VITALS — BP 146/94 | HR 79 | Temp 98.5°F | Resp 12 | Ht 73.0 in | Wt 194.0 lb

## 2019-09-22 DIAGNOSIS — F1120 Opioid dependence, uncomplicated: Secondary | ICD-10-CM

## 2019-09-22 DIAGNOSIS — Z021 Encounter for pre-employment examination: Secondary | ICD-10-CM

## 2019-09-22 DIAGNOSIS — K219 Gastro-esophageal reflux disease without esophagitis: Secondary | ICD-10-CM

## 2019-09-22 LAB — POCT URINALYSIS DIPSTICK
Bilirubin, UA: NEGATIVE
Blood, UA: NEGATIVE
Glucose, UA: NEGATIVE
Ketones, UA: NEGATIVE
Leukocytes, UA: NEGATIVE
Nitrite, UA: NEGATIVE
Protein, UA: NEGATIVE
Spec Grav, UA: 1.025 (ref 1.010–1.025)
Urobilinogen, UA: 0.2 E.U./dL
pH, UA: 6 (ref 5.0–8.0)

## 2019-09-22 MED ORDER — DEXILANT 30 MG PO CPDR: 30.0000 mg | DELAYED_RELEASE_CAPSULE | Freq: Every day | ORAL | 0 refills | Status: DC

## 2019-09-22 NOTE — Progress Notes (Signed)
Dx' d with Covid over a month ago.  States came out of quarantine 09/06/2019.  AMD

## 2019-09-22 NOTE — Addendum Note (Signed)
Addended by: Darlin Priestly on: 09/22/2019 06:07 PM   Modules accepted: Orders

## 2019-09-22 NOTE — Progress Notes (Signed)
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  Labs drawn today, results pending.  Patient with opioid dependence on agonist therapy (Suboxone) x3 years.  In [place] in 2011. Prescribed Percocet for back pain. Transitioned from Percocet, to Vicodin, to Tramadol. Attempted to discontinue Tramadol, unsuccessful. Receiving Suboxone through clinic in [place]. Gradually reducing dosage; down to 1/4 tablet once a day.  Disqualifying; NFPA 6.22.1 Discussed situation with occupational health medical director, Dr. Elaina Pattee.   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 Will gradually discontinue use of Suboxone over course of 6 months. Monthly follow-up with [facility] to discuss progress. Goal is to receive letter from prescribing clinic in [facility] that employee is no longer receiving Suboxone. Discussed plan with patient/employee; patient agrees.  Darlin Priestly, MHS, PA-C 09/22/2019

## 2019-09-23 LAB — CMP12+LP+TP+TSH+6AC+CBC/D/PLT
ALT: 49 IU/L — ABNORMAL HIGH (ref 0–44)
AST: 25 IU/L (ref 0–40)
Albumin/Globulin Ratio: 1.8 (ref 1.2–2.2)
Albumin: 4.8 g/dL (ref 4.1–5.2)
Alkaline Phosphatase: 94 IU/L (ref 39–117)
BUN/Creatinine Ratio: 15 (ref 9–20)
BUN: 15 mg/dL (ref 6–20)
Basophils Absolute: 0 10*3/uL (ref 0.0–0.2)
Basos: 0 %
Bilirubin Total: <0.2 mg/dL (ref 0.0–1.2)
Calcium: 9.6 mg/dL (ref 8.7–10.2)
Chloride: 99 mmol/L (ref 96–106)
Chol/HDL Ratio: 5.1 ratio — ABNORMAL HIGH (ref 0.0–5.0)
Cholesterol, Total: 209 mg/dL — ABNORMAL HIGH (ref 100–199)
Creatinine, Ser: 1.02 mg/dL (ref 0.76–1.27)
EOS (ABSOLUTE): 0 10*3/uL (ref 0.0–0.4)
Eos: 0 %
Estimated CHD Risk: 1.1 times avg. — ABNORMAL HIGH (ref 0.0–1.0)
Free Thyroxine Index: 1.8 (ref 1.2–4.9)
GFR calc Af Amer: 113 mL/min/{1.73_m2} (ref >59)
GFR calc non Af Amer: 98 mL/min/{1.73_m2} (ref >59)
GGT: 41 IU/L (ref 0–65)
Globulin, Total: 2.6 g/dL (ref 1.5–4.5)
HDL: 41 mg/dL (ref >39)
Hematocrit: 42.7 % (ref 37.5–51.0)
Hemoglobin: 15 g/dL (ref 13.0–17.7)
Immature Grans (Abs): 0 10*3/uL (ref 0.0–0.1)
Immature Granulocytes: 0 %
Iron: 67 ug/dL (ref 38–169)
LDH: 182 IU/L (ref 121–224)
LDL Chol Calc (NIH): 80 mg/dL (ref 0–99)
Lymphocytes Absolute: 2.6 10*3/uL (ref 0.7–3.1)
Lymphs: 34 %
MCH: 31.1 pg (ref 26.6–33.0)
MCHC: 35.1 g/dL (ref 31.5–35.7)
MCV: 88 fL (ref 79–97)
Monocytes Absolute: 0.5 10*3/uL (ref 0.1–0.9)
Monocytes: 6 %
Neutrophils Absolute: 4.7 10*3/uL (ref 1.4–7.0)
Neutrophils: 60 %
Platelets: 233 10*3/uL (ref 150–450)
RBC: 4.83 x10E6/uL (ref 4.14–5.80)
RDW: 13 % (ref 11.6–15.4)
Sodium: 139 mmol/L (ref 134–144)
T3 Uptake Ratio: 27 % (ref 24–39)
T4, Total: 6.8 ug/dL (ref 4.5–12.0)
TSH: 0.794 u[IU]/mL (ref 0.450–4.500)
Total Protein: 7.4 g/dL (ref 6.0–8.5)
Triglycerides: 548 mg/dL — ABNORMAL HIGH (ref 0–149)
Uric Acid: 6.3 mg/dL (ref 3.8–8.4)
VLDL Cholesterol Cal: 88 mg/dL — ABNORMAL HIGH (ref 5–40)
WBC: 7.9 10*3/uL (ref 3.4–10.8)

## 2019-09-24 ENCOUNTER — Telehealth: Payer: Self-pay

## 2019-09-24 NOTE — Telephone Encounter (Signed)
On 09/22/2019 Mitchell Priestly, PA-C (Interim Provider) renewed Rx for Dexilant 30 mg capsules, 1 cap po every day, #30, No Refills but there wasn't a pharmacy listed for him in Epic so the Rx printed out in the clinic.  Mitchell Burke was contacted to see what pharmacy he uses & [facility] [address].  Epic updated with info.  Dexilant 30 mg capsules, 1 cap po every day, #30, No Refills [name] NPI # [id number] Called into pharmacy.  AMD

## 2019-10-21 ENCOUNTER — Ambulatory Visit: Payer: 59 | Admitting: Occupational Medicine

## 2019-10-21 ENCOUNTER — Encounter: Payer: Self-pay | Admitting: Occupational Medicine

## 2019-10-21 ENCOUNTER — Other Ambulatory Visit: Payer: Self-pay

## 2019-10-21 VITALS — BP 144/78 | HR 79 | Temp 97.7°F | Ht 71.0 in | Wt 192.2 lb

## 2019-10-21 DIAGNOSIS — Z021 Encounter for pre-employment examination: Secondary | ICD-10-CM

## 2019-10-21 NOTE — Progress Notes (Signed)
  Subjective:     Patient ID: Mitchell Burke, male   DOB: Jul 07, 1991, 29 y.o.   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 Labs drawn today, results pending. Patient with opioid dependence on agonist therapy (Suboxone) x3 years.  In [place] in [place]. Prescribed Percocet for back pain. Transitioned from Percocet, to Vicodin, to Tramadol. Attempted to discontinue Tramadol, unsuccessful. Receiving Suboxone through clinic in [place]. Gradually reducing dosage; down to 1/4 tablet once a day. Disqualifying; NFPA 6.22.1 Discussed situation with occupational health medical director, Dr. Kathrine Haddock.  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 Will gradually discontinue use of Suboxone over course of 6 months. Monthly follow-up with [facility] to discuss progress. Goal is to receive letter from prescribing clinic in [facility] that employee is no longer receiving Suboxone. Discussed plan with patient/employee; patient agrees. Janalyn Harder, MHS, PA-C 09/22/2019  Mitchell Burke presents in follow-up today regarding tapering of Suboxone.  Tells me that he has been working full duty and has been feeling just fine.  He is still tapering and says he is taking about an 8 of an 8 mg tablet twice a day now.  He tells me that he is only used 5 pills in the last month.  His provider that is writing for his Suboxone [facility] [place].  The last visit with him was August 2020.  He says that he prefers to self taper his Suboxone medication and is  doing okay with this.  He tells me that he is having withdrawal symptoms of chills and insomnia and some loose stools and symptoms are improved compared to when he started taking less Suboxone about a month ago when we discovered this in his annual employment exam.  He tells me that he is managing insomnia with melatonin as needed with mixed results.  Takes no other medications.  I was able to verify with the PMP aware that indeed his last fill date for Suboxone 8 mg was October 24 and he was given 15 pills.  He says he has 8 or 9 pills left out of this prescription.     Review of Systems     Objective:   Physical Exam     Assessment:     Occupational Fit for Duty exam    Plan:     Overall it sounds like he is managing his tapering from Suboxone well and I applauded his dedication to discontinuing this medication.  He will follow-up with me in a month and we will continue to follow him monthly until he has completely discontinued Suboxone and is stable.  I would suggest that we follow him once a month for couple of months and then perhaps every 3 months for a couple of times and then annually after that.  We do need to remember to check [facility] aware every visit.

## 2019-11-25 ENCOUNTER — Ambulatory Visit: Payer: 59 | Admitting: Occupational Medicine

## 2019-11-25 ENCOUNTER — Other Ambulatory Visit: Payer: Self-pay

## 2019-11-25 VITALS — BP 130/88 | HR 80 | Temp 97.7°F | Ht 71.0 in | Wt 195.2 lb

## 2019-11-25 DIAGNOSIS — F1120 Opioid dependence, uncomplicated: Secondary | ICD-10-CM

## 2019-11-25 NOTE — Progress Notes (Signed)
Presents in follow-up for his tapering off Suboxone.  He has been completely off for the last 3 days and feels fine.  He was reporting to me complaints of insomnia.  He says he tries melatonin at bedtime and this works for about an hour but then he wakes up and still has insomnia.  This is been going on for quite some time.  Think that with his history of addiction that specialty sleep consultation would be appropriate.  I told him to consult with Dr. Vickey Huger at [facility] neurology.  He is doing extremely well off of the Suboxone and is having no withdrawal symptoms.  Working full duty.  Impression: Successful taper off Suboxone  Plan: Qualified for full duty.  No planned follow-up regarding this problem.

## 2019-11-27 ENCOUNTER — Ambulatory Visit: Payer: Self-pay | Admitting: Physician Assistant

## 2019-11-27 ENCOUNTER — Other Ambulatory Visit: Payer: Self-pay

## 2019-11-27 VITALS — BP 151/98 | HR 86 | Temp 97.7°F | Wt 196.0 lb

## 2019-11-27 DIAGNOSIS — L089 Local infection of the skin and subcutaneous tissue, unspecified: Secondary | ICD-10-CM

## 2019-11-27 MED ORDER — MUPIROCIN 2 % EX OINT: 1.0000 "application " | TOPICAL_OINTMENT | Freq: Every day | CUTANEOUS | 0 refills | Status: DC

## 2019-11-27 NOTE — Progress Notes (Signed)
   Subjective:    Patient ID: Mitchell Burke, male    DOB: [birth year], 29 y.o.   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      Objective:   Physical Exam Constitutional:      Appearance: Normal appearance. He is well-groomed.    Neurological:     Mental Status: He is alert.  Psychiatric:        Behavior: Behavior is cooperative.    Area cleansed- examined- no evidence of foreign body- Single hair fragment ( hairy legs) removed from small triangular 68mm wound. 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

## 2019-11-28 ENCOUNTER — Telehealth: Payer: Self-pay

## 2019-11-28 DIAGNOSIS — G4700 Insomnia, unspecified: Secondary | ICD-10-CM

## 2019-11-28 NOTE — Telephone Encounter (Signed)
Patient was seen on 11/25/19 by Dr. Alto Denver, she recommended patient to schedule an appointment with Dr.Dohmeier for insomnia. Patient called and states they informed him he will need a referral. Referral placed.

## 2019-12-01 ENCOUNTER — Ambulatory Visit: Payer: Self-pay | Admitting: Physician Assistant

## 2019-12-01 ENCOUNTER — Other Ambulatory Visit: Payer: Self-pay

## 2019-12-01 VITALS — BP 137/90 | HR 78 | Temp 98.1°F | Ht 71.0 in | Wt 196.6 lb

## 2019-12-01 DIAGNOSIS — L03115 Cellulitis of right lower limb: Secondary | ICD-10-CM

## 2019-12-01 DIAGNOSIS — L02415 Cutaneous abscess of right lower limb: Secondary | ICD-10-CM

## 2019-12-01 MED ORDER — SULFAMETHOXAZOLE-TRIMETHOPRIM 800-160 MG PO TABS: 2.0000 | ORAL_TABLET | Freq: Two times a day (BID) | ORAL | 0 refills | Status: AC

## 2019-12-01 NOTE — Progress Notes (Signed)
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 [employer]  Exam:  Ambulatory without restriction Wearing bandaid dressing  Does not have Muprirocin Rx with him  Right knee is mild/mod swollen symmetrically  Erythematous and warm to touch Initial abrasion appears healed Hairs have been abraded at base by dressing Locally warm to touch, fluctuant to touch Nothing localized Nothing draining to culture  Impression: Abscess suspected Cellulitis  Plan:   TMP-SMZ DS 2 tabs BID x 5 days Denies drug allergies  RTC 5 days - more quickly if increases Warm packs , elevate a possible (rice in white sock in M/W  Discussed) Cannot culture, cannot r/o MRSA  Continue Mupirocin 2% and knee bandaid To minimize abrading by jeans

## 2019-12-05 ENCOUNTER — Ambulatory Visit: Payer: Self-pay | Admitting: Physician Assistant

## 2019-12-05 ENCOUNTER — Other Ambulatory Visit: Payer: Self-pay

## 2019-12-05 VITALS — BP 149/83 | HR 80 | Temp 97.2°F | Wt 192.8 lb

## 2019-12-05 DIAGNOSIS — L02415 Cutaneous abscess of right lower limb: Secondary | ICD-10-CM

## 2019-12-05 NOTE — Progress Notes (Signed)
Follow up visit for abscess right knee Patient reports feels much better, discomfort dramatically better - notes that he has been cleansing it with his choice of alcohol. Has seen some pus drainage-this morning a "glob of snot" was expressed....  VSS afebrile feels well, reports mild discomfort only now  Right knee with resolving abscess No longer inflamed, swelling markedly reduced Small amount present lateral only Nothing can be expressed  Area is cleansed and re-dressed  Imp - Resolving abscess right knee  Plan - Have discussed MRSA with patient- we do not have culture to support but presentation was c/w. Reminded not to touch the area Keep band aid dressing In the future with any similar experience to inform then  provider of our suspicion at this time  Will renew Rx at half dose- thus 1 TMP-SMZ DS  BID x 5 days. Has tolerated 2 BID well to this point but can step  Down- [facility] Continue Mupirocin ointment with dressing changes  RTC Prn questions concerns or failure to completely resolve

## 2019-12-16 ENCOUNTER — Ambulatory Visit (INDEPENDENT_AMBULATORY_CARE_PROVIDER_SITE_OTHER): Payer: 59 | Admitting: Neurology

## 2019-12-16 ENCOUNTER — Other Ambulatory Visit: Payer: Self-pay

## 2019-12-16 ENCOUNTER — Encounter: Payer: Self-pay | Admitting: Neurology

## 2019-12-16 VITALS — BP 138/87 | HR 70 | Temp 97.6°F | Ht 71.0 in | Wt 192.0 lb

## 2019-12-16 DIAGNOSIS — G4701 Insomnia due to medical condition: Secondary | ICD-10-CM

## 2019-12-16 DIAGNOSIS — R252 Cramp and spasm: Secondary | ICD-10-CM

## 2019-12-16 DIAGNOSIS — T17308A Unspecified foreign body in larynx causing other injury, initial encounter: Secondary | ICD-10-CM

## 2019-12-16 DIAGNOSIS — G253 Myoclonus: Secondary | ICD-10-CM

## 2019-12-16 DIAGNOSIS — F19982 Other psychoactive substance use, unspecified with psychoactive substance-induced sleep disorder: Secondary | ICD-10-CM

## 2019-12-16 DIAGNOSIS — G478 Other sleep disorders: Secondary | ICD-10-CM

## 2019-12-16 MED ORDER — TRAZODONE HCL 50 MG PO TABS: 50.0000 mg | ORAL_TABLET | Freq: Every day | ORAL | 5 refills | Status: DC
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
## 2019-12-16 NOTE — Addendum Note (Signed)
Addended by: Melvyn Novas on: 12/16/2019 09:51 AM   Modules accepted: Orders

## 2020-01-12 ENCOUNTER — Ambulatory Visit (INDEPENDENT_AMBULATORY_CARE_PROVIDER_SITE_OTHER): Payer: 59 | Admitting: Neurology

## 2020-01-12 ENCOUNTER — Other Ambulatory Visit: Payer: Self-pay

## 2020-01-12 DIAGNOSIS — G478 Other sleep disorders: Secondary | ICD-10-CM

## 2020-01-12 DIAGNOSIS — R002 Palpitations: Secondary | ICD-10-CM

## 2020-01-12 DIAGNOSIS — G253 Myoclonus: Secondary | ICD-10-CM

## 2020-01-12 DIAGNOSIS — G4701 Insomnia due to medical condition: Secondary | ICD-10-CM

## 2020-01-12 DIAGNOSIS — T17308A Unspecified foreign body in larynx causing other injury, initial encounter: Secondary | ICD-10-CM

## 2020-01-12 DIAGNOSIS — R252 Cramp and spasm: Secondary | ICD-10-CM

## 2020-01-12 DIAGNOSIS — G4733 Obstructive sleep apnea (adult) (pediatric): Secondary | ICD-10-CM

## 2020-01-12 DIAGNOSIS — F19982 Other psychoactive substance use, unspecified with psychoactive substance-induced sleep disorder: Secondary | ICD-10-CM
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
## 2020-01-21 NOTE — Addendum Note (Signed)
Addended by: Melvyn Novas on: 01/21/2020 01:09 PM   Modules accepted: Orders

## 2020-01-21 NOTE — Progress Notes (Signed)
Summary & Diagnosis:   No clinically significant degree of sleep apnea with an AHI of  2.0/h.-and usually any AHI under 5 is not treated.  There is snoring noted, and tachy-bradycardia is clearly present-  the highly variable cardiac rate is not correlated to AHI or  hypoxic events.   Recommendations:     I recommend an attended sleep study to further investigate the  possibility of RLS/ PLMs and to evaluate the nocturnal cardiac  rhythm and not just rate. I feel certain that there is no sleep  apnea disorder.  Interpreting Physician: Melvyn Novas, MD   CC Kathrine Haddock, MD

## 2020-01-22 ENCOUNTER — Telehealth: Payer: Self-pay | Admitting: Neurology

## 2020-01-22 ENCOUNTER — Encounter: Payer: Self-pay | Admitting: Neurology

## 2020-01-22 NOTE — Telephone Encounter (Signed)
-----   Message from Melvyn Novas, MD sent at 01/21/2020  1:09 PM EDT ----- Summary & Diagnosis:   No clinically significant degree of sleep apnea with an AHI of  2.0/h.-and usually any AHI under 5 is not treated.  There is snoring noted, and tachy-bradycardia is clearly present-  the highly variable cardiac rate is not correlated to AHI or  hypoxic events.   Recommendations:     I recommend an attended sleep study to further investigate the  possibility of RLS/ PLMs and to evaluate the nocturnal cardiac  rhythm and not just rate. I feel certain that there is no sleep  apnea disorder.  Interpreting Physician: Melvyn Novas, MD   CC Kathrine Haddock, MD

## 2020-01-22 NOTE — Telephone Encounter (Signed)
Called the patient to discuss sleep study results. There was no answer. And VM box was full. Unable to lvm. I will send a mychart message as well.

## 2020-02-12 ENCOUNTER — Other Ambulatory Visit: Payer: Self-pay

## 2020-02-12 ENCOUNTER — Ambulatory Visit (INDEPENDENT_AMBULATORY_CARE_PROVIDER_SITE_OTHER): Payer: 59 | Admitting: Neurology

## 2020-02-12 DIAGNOSIS — R252 Cramp and spasm: Secondary | ICD-10-CM

## 2020-02-12 DIAGNOSIS — G478 Other sleep disorders: Secondary | ICD-10-CM

## 2020-02-12 DIAGNOSIS — G4761 Periodic limb movement disorder: Secondary | ICD-10-CM | POA: Diagnosis not present

## 2020-02-12 DIAGNOSIS — R002 Palpitations: Secondary | ICD-10-CM

## 2020-02-12 DIAGNOSIS — T17308A Unspecified foreign body in larynx causing other injury, initial encounter: Secondary | ICD-10-CM

## 2020-02-12 DIAGNOSIS — G253 Myoclonus: Secondary | ICD-10-CM

## 2020-02-18 MED ORDER — PRAMIPEXOLE DIHYDROCHLORIDE 0.125 MG PO TABS: ORAL_TABLET | ORAL | 5 refills | Status: DC

## 2020-02-18 NOTE — Addendum Note (Signed)
Addended by: Melvyn Novas on: 02/18/2020 04:57 PM   Modules accepted: Orders
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
## 2020-02-18 NOTE — Procedures (Signed)
PATIENT'S NAME:  Mitchell Burke, Mitchell Burke DOB:      [birth year]      MR#:    [id number]     DATE OF RECORDING: 02/12/2020 REFERRING M.D.:  Kathrine Haddock, MD - [facility]  Study Performed:   Polysomnogram with Parasomnia/ PLM montage HISTORY:    Mitchell Burke is a 29 year old male patient  of Hispanic origin who has a medical history of Anxiety, Chronic pain, and GERD (gastroesophageal reflux disease).  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  In 2011 he developed back pain after an injury and was unfortunately treated with opiates for a while and then able to go to Suboxone.  During the year 2020 he weaned completely off narcotic medication.  Some of his current sleep concerns may still be related to weaning off medication that by itself is sleep inducing.  His wife has noted him to twitch quite a bit at night and this could be related to back disease or radiculopathy. He also reports abnormal sensations in both lower extremities.  It is not an irresistible urge to move but a tingling and prickling sensation and it is bothersome when he tries to go to sleep or when he just woke up and tries to resume sleep.   In addition, he has noted insomnia, and has tried melatonin which has helped him to go to sleep but he feels a have no positive effect on the duration of sleep he achieved.   Again, he took sleep inducing medication for quite a while and his body may still adjust to it, however with his shift work he certainly deserves a more restorative and refreshing sleep on his days off.  The patient was screened for OSA by a HST which was negative for apnea- AHI 2/h. and higher RDI and tachy brady cardia were present. I recommended an attended sleep study to further investigate the  possibility of RLS/ PLMs and to evaluate the nocturnal cardiac rhythm and not just rate. I feel certain that there is no sleep  apnea disorder.  The patient endorsed the Epworth Sleepiness  Scale at 4 points.   The patient's weight 192 pounds with a height of 71 (inches), resulting in a BMI of 26.9 kg/m2. The patient's neck circumference measured 16.8 inches.  CURRENT MEDICATIONS: None   PROCEDURE:  This is a multichannel digital polysomnogram utilizing the Somnostar 11.2 system.  Electrodes and sensors were applied and monitored per AASM Specifications.   EEG, EOG, Chin and Limb EMG, were sampled at 200 Hz.  ECG, Snore and Nasal Pressure, Thermal Airflow, Respiratory Effort, CPAP Flow and Pressure, Oximetry was sampled at 50 Hz. Digital video and audio were recorded.      BASELINE STUDY : Lights Out was at 22:10 and Lights On at 04:30.  Total recording time (TRT) was 380 minutes, with a total sleep time (TST) of 331 minutes.  The patient's sleep latency was 17.5 minutes.  REM latency was 199 minutes.  The sleep efficiency was 87.1 %.     SLEEP ARCHITECTURE: WASO (Wake after sleep onset) was 38.5 minutes.  There were 22 minutes in Stage N1, 147 minutes Stage N2, 100 minutes Stage N3 and 62 minutes in Stage REM.  The percentage of Stage N1 was 6.6%, Stage N2 was 44.4%, Stage N3 was 30.2% and Stage R (REM sleep) was 18.7%.   RESPIRATORY ANALYSIS:  There were a total of 0 respiratory events:  0 obstructive apneas, 0  central apneas and 0 mixed apneas with a total of 0 apneas and an apnea index (AI) of 0 /hour. There were 0 hypopneas with a hypopnea index of 0 /hour. The patient also had 0 respiratory event related arousals (RERAs).      The total APNEA/HYPOPNEA INDEX (AHI) was 0/hour and the total RESPIRATORY DISTURBANCE INDEX was  0 /hour.  0 events occurred in REM sleep and 0 events in NREM. The REM AHI was  0 /hour, versus a non-REM AHI of 0. The patient spent 48.5 minutes of total sleep time in the supine position and 283 minutes in non-supine.. The supine AHI was 0.0 versus a non-supine AHI of 0.0.  OXYGEN SATURATION & C02:  The Wake baseline 02 saturation was 96%, with the lowest  being 92%. Time spent below 89% saturation equaled 0 minutes.  The arousals were noted as: 49 were spontaneous, 347 were associated with PLMs, 0 were associated with respiratory events. The patient had a total of 546 Periodic Limb Movements.  The Periodic Limb Movement (PLM) Arousal index was 62.9/hour.  Video analysis did show ongoing limb movements through NREM sleep but no complex behaviors, phonations or vocalizations.   Snoring was noted. EKG was in keeping with normal sinus rhythm (NSR). PLMs caused motion related artefact in EKG channel.    IMPRESSION:  1. SEVERE Periodic Limb Movement Disorder (PLMD), penetrating even N3 sleep, and sparing only REM sleep. The movement seems to primarily involve the left lower extremity, but is severe enough to lead to motion artefact in EKG and pulsoximetry.  2. Moderate Primary Snoring, accentuated in supine sleep. No evidence of sleep apnea.    RECOMMENDATIONS:  1. The patient has severe sleep interruptions through frequent arousals caused by PLMs. There is no REM sleep disorder as the condition spares REM sleep. This is not the manifestation of a neurodegenerative disorder. 2. Treatment with RLS medication should be started ASAP. These are non-narcotic, non-addictive medications.  3. The most likely origin for a young man is from an injury, either leading to myelopathy or radiculopathy.    I certify that I have reviewed the entire raw data recording prior to the issuance of this report in accordance with the Standards of Accreditation of the American Academy of Sleep Medicine (AASM)   Larey Seat, MD Diplomat, American Board of Psychiatry and Neurology  Diplomat, American Board of Sleep Medicine Market researcher, [facility] [employer]

## 2020-02-18 NOTE — Progress Notes (Signed)
  IMPRESSION:  1. SEVERE Periodic Limb Movement Disorder (PLMD), penetrating even N3 sleep, and sparing only REM sleep. The movement seems to primarily involve the left lower extremity, but is severe enough to lead to motion artefact in EKG and pulsoximetry.  2. Moderate Primary Snoring, accentuated in supine sleep. No evidence of sleep apnea.    RECOMMENDATIONS:  1. The patient has severe sleep interruptions through frequent arousals caused by PLMs. There is no REM sleep disorder as the condition spares REM sleep. This is not the manifestation of a neurodegenerative disorder. 2. Treatment with RLS medication should be started ASAP. These are non-narcotic, non-addictive medications.  3. The most likely origin for a young man is from an injury, either leading to myelopathy or radiculopathy manifesting this way. The unilateral distribution favors a radiculopathy.    I certify that I have reviewed the entire raw data recording prior to the issuance of this report in accordance with the Standards of Accreditation of the American Academy of Sleep Medicine (AASM)   Konstance Happel, MD 

## 2020-02-19 ENCOUNTER — Telehealth: Payer: Self-pay | Admitting: Neurology

## 2020-02-19 NOTE — Telephone Encounter (Signed)
-----   Message from Melvyn Novas, MD sent at 02/18/2020  4:57 PM EDT -----  IMPRESSION:  1. SEVERE Periodic Limb Movement Disorder (PLMD), penetrating even N3 sleep, and sparing only REM sleep. The movement seems to primarily involve the left lower extremity, but is severe enough to lead to motion artefact in EKG and pulsoximetry.  2. Moderate Primary Snoring, accentuated in supine sleep. No evidence of sleep apnea.    RECOMMENDATIONS:  1. The patient has severe sleep interruptions through frequent arousals caused by PLMs. There is no REM sleep disorder as the condition spares REM sleep. This is not the manifestation of a neurodegenerative disorder. 2. Treatment with RLS medication should be started ASAP. These are non-narcotic, non-addictive medications.  3. The most likely origin for a young man is from an injury, either leading to myelopathy or radiculopathy manifesting this way. The unilateral distribution favors a radiculopathy.    I certify that I have reviewed the entire raw data recording prior to the issuance of this report in accordance with the Standards of Accreditation of the American Academy of Sleep Medicine (AASM)   Melvyn Novas, MD

## 2020-02-19 NOTE — Telephone Encounter (Signed)
Called the patient and reviewed the in lab study with the patient. Advised the patient that Dr Vickey Huger did note there was severe PLM's noted in the study. Everything else looked good but due to the significance/severity of the PLMS she wants to get him set up on a medication to help with this. Patient has never taken anything before. Dr Dohmeier called mirapex in for the patient.  Pt currently was started on trazodone to help with falling asleep and asked if he should take both. Advised the patient that he can certainly try without taking the trazodone to see if the mirapex helps him with falling asleep. If it does then the trazodone can be dc'd. Advised if he finds still needs the trazodone they are ok to take together. Pt verbalized understanding. Scheduled the patient for a 2 month f/u to allow patient to start medication and advised apt will be to discuss SS in more detail and see how treatment plan is working. Pt verbalized understanding.

## 2020-04-12 ENCOUNTER — Ambulatory Visit: Payer: Self-pay | Admitting: Neurology

## 2020-04-27 ENCOUNTER — Ambulatory Visit: Payer: Self-pay | Admitting: Neurology

## 2020-05-25 ENCOUNTER — Ambulatory Visit: Payer: Self-pay | Admitting: Neurology

## 2020-06-10 ENCOUNTER — Encounter: Payer: Self-pay | Admitting: Neurology

## 2020-06-10 ENCOUNTER — Ambulatory Visit (INDEPENDENT_AMBULATORY_CARE_PROVIDER_SITE_OTHER): Payer: 59 | Admitting: Neurology

## 2020-06-10 ENCOUNTER — Telehealth: Payer: Self-pay | Admitting: Neurology

## 2020-06-10 VITALS — BP 127/78 | HR 77 | Ht 71.0 in | Wt 200.0 lb

## 2020-06-10 DIAGNOSIS — R208 Other disturbances of skin sensation: Secondary | ICD-10-CM

## 2020-06-10 DIAGNOSIS — T733XXD Exhaustion due to excessive exertion, subsequent encounter: Secondary | ICD-10-CM | POA: Diagnosis not present

## 2020-06-10 DIAGNOSIS — G510 Bell's palsy: Secondary | ICD-10-CM | POA: Diagnosis not present

## 2020-06-10 DIAGNOSIS — R200 Anesthesia of skin: Secondary | ICD-10-CM

## 2020-06-10 DIAGNOSIS — R252 Cramp and spasm: Secondary | ICD-10-CM

## 2020-06-10 DIAGNOSIS — F19982 Other psychoactive substance use, unspecified with psychoactive substance-induced sleep disorder: Secondary | ICD-10-CM

## 2020-06-10 MED ORDER — PRAMIPEXOLE DIHYDROCHLORIDE 0.5 MG PO TABS: 0.5000 mg | ORAL_TABLET | Freq: Every day | ORAL | 1 refills | Status: DC

## 2020-06-10 NOTE — Telephone Encounter (Signed)
[employer] order sent to GI. They will reach out to the patient to schedule.  
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
## 2020-06-10 NOTE — Patient Instructions (Signed)

## 2020-06-11 LAB — CBC WITH DIFFERENTIAL/PLATELET
Basophils Absolute: 0 10*3/uL (ref 0.0–0.2)
Basos: 1 %
EOS (ABSOLUTE): 0 10*3/uL (ref 0.0–0.4)
Eos: 0 %
Hematocrit: 45.7 % (ref 37.5–51.0)
Hemoglobin: 16.1 g/dL (ref 13.0–17.7)
Immature Grans (Abs): 0 10*3/uL (ref 0.0–0.1)
Immature Granulocytes: 0 %
Lymphocytes Absolute: 2.2 10*3/uL (ref 0.7–3.1)
Lymphs: 34 %
MCH: 31.8 pg (ref 26.6–33.0)
MCHC: 35.2 g/dL (ref 31.5–35.7)
MCV: 90 fL (ref 79–97)
Monocytes Absolute: 0.4 10*3/uL (ref 0.1–0.9)
Monocytes: 7 %
Neutrophils Absolute: 3.7 10*3/uL (ref 1.4–7.0)
Neutrophils: 58 %
Platelets: 241 10*3/uL (ref 150–450)
RBC: 5.06 x10E6/uL (ref 4.14–5.80)
RDW: 12.8 % (ref 11.6–15.4)
WBC: 6.4 10*3/uL (ref 3.4–10.8)

## 2020-06-11 LAB — COMPREHENSIVE METABOLIC PANEL
ALT: 35 IU/L (ref 0–44)
AST: 30 IU/L (ref 0–40)
Albumin/Globulin Ratio: 1.6 (ref 1.2–2.2)
Albumin: 4.9 g/dL (ref 4.1–5.2)
Alkaline Phosphatase: 83 IU/L (ref 48–121)
BUN/Creatinine Ratio: 14 (ref 9–20)
BUN: 16 mg/dL (ref 6–20)
Bilirubin Total: 0.3 mg/dL (ref 0.0–1.2)
CO2: 23 mmol/L (ref 20–29)
Calcium: 9.5 mg/dL (ref 8.7–10.2)
Chloride: 99 mmol/L (ref 96–106)
Creatinine, Ser: 1.16 mg/dL (ref 0.76–1.27)
GFR calc Af Amer: 97 mL/min/{1.73_m2} (ref >59)
GFR calc non Af Amer: 84 mL/min/{1.73_m2} (ref >59)
Globulin, Total: 3 g/dL (ref 1.5–4.5)
Glucose: 84 mg/dL (ref 65–99)
Potassium: 4 mmol/L (ref 3.5–5.2)
Sodium: 138 mmol/L (ref 134–144)
Total Protein: 7.9 g/dL (ref 6.0–8.5)

## 2020-06-11 LAB — TESTOSTERONE: Testosterone: 316 ng/dL (ref 264–916)

## 2020-06-11 LAB — TSH+FREE T4
Free T4: 1.24 ng/dL (ref 0.82–1.77)
TSH: 0.478 u[IU]/mL (ref 0.450–4.500)

## 2020-06-11 NOTE — Progress Notes (Signed)
All normal : results of metabolic panel, TSH and T4( thyroid function), CBC and differential, and Testosterone levels for 53-31 year old male.  Recommend to take vit B12 ( 100 % of rec.dose) and Vit D ( 2000 units) over the counter- if not taking these already, this may help with energy levels.

## 2020-06-14 ENCOUNTER — Telehealth: Payer: Self-pay

## 2020-06-14 NOTE — Telephone Encounter (Signed)
All normal : results of metabolic panel, TSH and T4( thyroid function), CBC and differential, and Testosterone levels for 85-31 year old male.  Recommend to take vit b12 ( 100 % of rec.dose) and Vit D ( 2000 units) over the counter- this may help with energy levels.  Written by Melvyn Novas, MD on 06/11/2020 10:19 AM EDT

## 2020-06-14 NOTE — Telephone Encounter (Signed)
Attempted to reach the pt.  Pt was not available and vm was not set up. Will try again at a later time.

## 2020-06-15 NOTE — Telephone Encounter (Addendum)
Attempted to reach the pt for the second time. Pt was not available and vm was not set up. My chart message sent with lab results included. Pt advised to call or message back if he had any questions.

## 2020-06-20 ENCOUNTER — Other Ambulatory Visit: Payer: 59

## 2020-06-21 ENCOUNTER — Telehealth: Payer: Self-pay | Admitting: Neurology

## 2020-06-21 NOTE — Telephone Encounter (Signed)
Called the patient back and he was stating that he has noticed that he has developed numbness on the left side of the body. He has also noticed this when he was on the lower dose but it has intensified since increasing the dose. He also noted that confusion as a concern.  Pt will for now hold off on this medication. I asked if he was interested in an alternative but he doesn't want to add anything additional. He is hoping that some of the side effects will die down now that he has stopped the medication. He will notify us if he changes his mind and would like to start something different.

## 2020-06-21 NOTE — Telephone Encounter (Signed)
Pt asking to be called to discuss him not taking any medications due to him feeling weird on medication. Please call pt to discuss.

## 2020-06-25 ENCOUNTER — Ambulatory Visit
Admission: RE | Admit: 2020-06-25 | Discharge: 2020-06-25 | Disposition: A | Discharge status: Home / Self Care | Payer: 59 | Source: Ambulatory Visit | Attending: Neurology | Admitting: Neurology

## 2020-06-25 ENCOUNTER — Other Ambulatory Visit: Payer: Self-pay

## 2020-06-25 DIAGNOSIS — G510 Bell's palsy: Secondary | ICD-10-CM

## 2020-06-25 MED ORDER — GADOBENATE DIMEGLUMINE 529 MG/ML IV SOLN
18.0000 mL | Freq: Once | INTRAVENOUS | Status: AC | PRN
  Administered 2020-06-25: 18 mL via INTRAVENOUS

## 2020-06-26 ENCOUNTER — Ambulatory Visit
Admission: RE | Admit: 2020-06-26 | Discharge: 2020-06-26 | Disposition: A | Discharge status: Home / Self Care | Payer: 59 | Source: Ambulatory Visit | Attending: Neurology | Admitting: Neurology

## 2020-06-26 DIAGNOSIS — R252 Cramp and spasm: Secondary | ICD-10-CM

## 2020-06-26 DIAGNOSIS — R200 Anesthesia of skin: Secondary | ICD-10-CM

## 2020-06-26 DIAGNOSIS — F19982 Other psychoactive substance use, unspecified with psychoactive substance-induced sleep disorder: Secondary | ICD-10-CM

## 2020-06-26 DIAGNOSIS — R208 Other disturbances of skin sensation: Secondary | ICD-10-CM

## 2020-06-26 MED ORDER — GADOBENATE DIMEGLUMINE 529 MG/ML IV SOLN
20.0000 mL | Freq: Once | INTRAVENOUS | Status: AC | PRN
  Administered 2020-06-26: 20 mL via INTRAVENOUS

## 2020-06-28 ENCOUNTER — Encounter: Payer: Self-pay | Admitting: Neurology

## 2020-06-28 NOTE — Progress Notes (Signed)
Patient with severe PLMs- and paresthesias.  FINDINGS:  The cervical vertebrae demonstrate slight loss of forward lordotic curvature but normal body height and marrow signal characteristics except at C6-7 where there is loss of disc height with subligamentous broad-based central disc protrusion but without significant compression.  There is mild left greater than right foraminal narrowing at C6-7 as well.  There is no other frank disc condition cord compression root or foraminal encroachment noted.  Visualized portion of the brainstem and upper cervical spine appear unremarkable.  Upper thoracic spine also showed no abnormalities.  Visualized paraspinal soft tissue appear unremarkable.  Postcontrast images do not result in abnormal areas of enhancement.    IMPRESSION: Abnormal MRI scan cervical spine showing broad-based central disc protrusion at C6-7 with mild left greater than right foraminal narrowing but no significant compression.  No enhancing lesions are noted. Dr Pearlean Brownie, MD 06-26-2020

## 2020-06-28 NOTE — Progress Notes (Signed)
IMPRESSION: Unremarkable MRI scan of the brain with and without contrast.  Incidental chronic paranasal sinus inflammatory tissue is noted especially over the sphenoid sinus and over the petrous apex on the right   INTERPRETING PHYSICIAN: Delia Heady, MD on 06-26-20.  No evidence of inflammatory brain changes, demyelination or strokes.

## 2020-06-29 ENCOUNTER — Encounter: Payer: Self-pay | Admitting: Neurology

## 2020-06-29 ENCOUNTER — Telehealth: Payer: Self-pay | Admitting: Neurology

## 2020-06-29 ENCOUNTER — Other Ambulatory Visit: Payer: Self-pay

## 2020-06-29 ENCOUNTER — Ambulatory Visit (INDEPENDENT_AMBULATORY_CARE_PROVIDER_SITE_OTHER): Payer: 59 | Admitting: Neurology

## 2020-06-29 DIAGNOSIS — R252 Cramp and spasm: Secondary | ICD-10-CM | POA: Diagnosis not present

## 2020-06-29 DIAGNOSIS — F19982 Other psychoactive substance use, unspecified with psychoactive substance-induced sleep disorder: Secondary | ICD-10-CM

## 2020-06-29 DIAGNOSIS — R208 Other disturbances of skin sensation: Secondary | ICD-10-CM

## 2020-06-29 NOTE — Telephone Encounter (Signed)
-----   Message from Melvyn Novas, MD sent at 06/28/2020 10:26 AM EDT ----- Patient with severe PLMs- and paresthesias.  FINDINGS:  The cervical vertebrae demonstrate slight loss of forward lordotic curvature but normal body height and marrow signal characteristics except at C6-7 where there is loss of disc height with subligamentous broad-based central disc protrusion but without significant compression.  There is mild left greater than right foraminal narrowing at C6-7 as well.  There is no other frank disc condition cord compression root or foraminal encroachment noted.  Visualized portion of the brainstem and upper cervical spine appear unremarkable.  Upper thoracic spine also showed no abnormalities.  Visualized paraspinal soft tissue appear unremarkable.  Postcontrast images do not result in abnormal areas of enhancement.    IMPRESSION: Abnormal MRI scan cervical spine showing broad-based central disc protrusion at C6-7 with mild left greater than right foraminal narrowing but no significant compression.  No enhancing lesions are noted. Dr Pearlean Brownie, MD 06-26-2020

## 2020-06-29 NOTE — Telephone Encounter (Signed)
Called the patient and reviewed the MRI of the cervical spine.reviewed there was slight disk protrusion with no significant nerve compression. Advised that usually we treat depending on symptoms. If symptoms are bad then we could consider referral to PT to evaluate and work with him and if worsening a referral to neurosurgery to advise if there is further intervention. Pt states at this time symptoms are not too bothersome. Advised that if they do he can always talk to his PCP about those referrals mentioned above or contact us with any issues. Recapped the brain MRI results and advised no abnormal findings on brain. Informed him to follow up with pcp on the sinus inflammation that was present on the MRI. He verbalized understanding and had no further questions.

## 2020-06-29 NOTE — Progress Notes (Signed)
[facility]    Nerve / Sites Muscle Latency Ref. Amplitude Ref. Rel Amp Segments Distance Velocity Ref. Area    ms ms mV mV %  cm m/s m/s mVms  L Median - APB     Wrist APB 3.1 ?4.4 12.4 ?4.0 100 Wrist - APB 7   41.8     Upper arm APB 7.4  12.2  98.1 Upper arm - Wrist 25 58 ?49 41.4  L Ulnar - ADM     Wrist ADM 3.0 ?3.3 11.7 ?6.0 100 Wrist - ADM 7   34.8     B.Elbow ADM 6.8  9.5  81.6 B.Elbow - Wrist 21 56 ?49 32.4     A.Elbow ADM 8.8  9.7  102 A.Elbow - B.Elbow 10 50 ?49 39.8  L Peroneal - EDB     Ankle EDB 4.9 ?6.5 7.4 ?2.0 100 Ankle - EDB 9   29.9     Fib head EDB 11.6  6.0  80.9 Fib head - Ankle 32 48 ?44 26.1     Pop fossa EDB 13.8  5.3  89 Pop fossa - Fib head 10 47 ?44 23.5         Pop fossa - Ankle      L Tibial - AH     Ankle AH 4.6 ?5.8 19.1 ?4.0 100 Ankle - AH 9   40.3     Pop fossa AH 13.5  16.0  83.8 Pop fossa - Ankle 39 44 ?41 40.2             [facility]    Nerve / Sites Rec. Site Peak Lat Ref.  Amp Ref. Segments Distance    ms ms V V  cm  L Sural - Ankle (Calf)     Calf Ankle 4.4 ?4.4 21 ?6 Calf - Ankle 14  L Superficial peroneal - Ankle     Lat leg Ankle 5.0 ?4.4 4 ?6 Lat leg - Ankle 14  L Median - Orthodromic (Dig II, Mid palm)     Dig II Wrist 3.2 ?3.4 14 ?10 Dig II - Wrist 13  L Ulnar - Orthodromic, (Dig V, Mid palm)     Dig V Wrist 2.8 ?3.1 7 ?5 Dig V - Wrist 63             F  Wave    Nerve F Lat Ref.   ms ms  L Tibial - AH 53.4 ?56.0  L Ulnar - ADM 32.3 ?32.0

## 2020-06-29 NOTE — Procedures (Signed)
     HISTORY:  Mitchell Burke is a 29 year old gentleman with a history of left face, arm, and leg numbness since July 2021.  He has gone off of Mirapex and the numbness seems to be improving.  He is being evaluated for the numbness.  NERVE CONDUCTION STUDIES:  Nerve conduction studies were performed on the left upper extremity. The distal motor latencies and motor amplitudes for the median and ulnar nerves were within normal limits. The nerve conduction velocities for these nerves were also normal. The sensory latencies for the median and ulnar nerves were normal. The F wave latency for the ulnar nerve was slightly prolonged.  Nerve conduction studies were performed on the left lower extremity. The distal motor latencies and motor amplitudes for the peroneal and posterior tibial nerves were within normal limits. The nerve conduction velocities for these nerves were also normal. The sensory latency for the sural nerve was within normal limits.  The peroneal sensory latency was slightly prolonged.  The F wave latency for the posterior tibial nerve was within normal limits.   EMG STUDIES:  EMG study was performed on the left lower extremity:  The tibialis anterior muscle reveals 2 to 4K motor units with full recruitment. No fibrillations or positive waves were seen. The peroneus tertius muscle reveals 2 to 4K motor units with full recruitment. No fibrillations or positive waves were seen. The medial gastrocnemius muscle reveals 1 to 3K motor units with full recruitment. No fibrillations or positive waves were seen. The vastus lateralis muscle reveals 2 to 4K motor units with full recruitment. No fibrillations or positive waves were seen. The iliopsoas muscle reveals 2 to 4K motor units with full recruitment. No fibrillations or positive waves were seen. The biceps femoris muscle (long head) reveals 2 to 4K motor units with full recruitment. No fibrillations or positive waves were seen. The  lumbosacral paraspinal muscles were tested at 3 levels, and revealed no abnormalities of insertional activity at all 3 levels tested. There was good relaxation.   IMPRESSION:  Nerve conduction studies done on the left upper and left lower extremities were relatively unremarkable with exception of slight prolongation of the left peroneal sensory latency.  No clear evidence of a generalized neuropathy was seen.  EMG evaluation of the left lower extremity was unremarkable, without evidence of an overlying lumbosacral radiculopathy.   Marlan Palau MD 06/29/2020 2:02 PM  [facility] [address] [place], [place] [address]  Phone [phone] Fax [phone]

## 2020-06-29 NOTE — Progress Notes (Signed)
Please refer to EMG and nerve conduction procedure note.  

## 2020-06-30 ENCOUNTER — Encounter: Payer: Self-pay | Admitting: Neurology

## 2020-07-07 ENCOUNTER — Other Ambulatory Visit: Payer: Self-pay

## 2020-07-07 ENCOUNTER — Encounter: Payer: Self-pay | Admitting: Emergency Medicine

## 2020-07-07 ENCOUNTER — Ambulatory Visit: Payer: Self-pay | Admitting: Emergency Medicine

## 2020-07-07 VITALS — BP 153/93 | HR 71 | Temp 97.3°F | Resp 16 | Ht 71.0 in | Wt 199.0 lb

## 2020-07-07 DIAGNOSIS — R0981 Nasal congestion: Secondary | ICD-10-CM

## 2020-07-07 MED ORDER — PREDNISONE 10 MG PO TABS: ORAL_TABLET | ORAL | 0 refills | Status: DC

## 2020-07-07 MED ORDER — AZELASTINE HCL 0.1 % NA SOLN: 2.0000 | Freq: Two times a day (BID) | NASAL | 2 refills | Status: DC

## 2020-07-07 NOTE — Progress Notes (Signed)
Pt presents today having chronic inflammation in sinus tissue. Pt states he did the virtual visit and the medication that was prescribed did not work. This has been going on for about a month and 1 half.

## 2020-07-07 NOTE — Progress Notes (Signed)
I have reviewed the triage vital signs and the nursing notes.   HISTORY  Chief Complaint Sinusitis  HPI Mitchell Burke is a 29 y.o. male presents to the clinic with concerns of sinus infection.  Patient recently had a MRI of his brain done to evaluate numbness that he was feeling on the left side of his body.  MRI was negative for any acute changes however patient saw that there was an incidental finding of chronic sinus changes.  He had a virtual visit and initially was prescribed 5 days of Augmentin which did not relieve his symptoms.  He states now he has pressure around his nose.  He denies any fever, chills, nausea or vomiting.  He also was treated with a 7-day course of doxycycline which did not relieve his symptoms of pressure.        Past Medical History:  Diagnosis Date  . Anxiety   . Chronic pain    Suboxone used, prescribed by MD in [place]  . GERD (gastroesophageal reflux disease)     Patient Active Problem List   Diagnosis Date Noted  . UARS (upper airway resistance syndrome) 12/16/2019  . 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 Leg cramping 12/16/2019  . Choking 12/16/2019  . 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  Prior to Admission medications   Medication Sig Start Date End Date Taking? Authorizing Provider  traZODone (DESYREL) 50 MG tablet Take 1 tablet (50 mg total) by mouth at bedtime. 12/16/19  Yes Dohmeier, Porfirio Mylar, MD  pramipexole (MIRAPEX) 0.5 MG tablet Take 1 tablet (0.5 mg total) by mouth at bedtime. 06/10/20   Dohmeier, Porfirio Mylar, MD    Allergies Patient has no known allergies.  No family history on file.  Social History Social History   Tobacco Use  . Smoking status: Current Some Day Smoker    Types: Cigarettes  . Smokeless tobacco: Never Used  Substance Use Topics  . Alcohol use: No  . Drug use: No    Comment: uses Suboxone    Review of Systems Constitutional: No  fever/chills Eyes: No visual changes. ENT: No sore throat.  Sinus congestion.  Positive sinus pressure. Cardiovascular: Denies chest pain. Respiratory: Denies shortness of breath.  Negative for cough. Gastrointestinal: No abdominal pain.  No nausea, no vomiting.   Musculoskeletal: Negative for muscle skeletal pain. Skin: Negative for rash. Neurological: Negative for headaches, focal weakness or numbness.  ____________________________________________   PHYSICAL EXAM: Constitutional: Alert and oriented. Well appearing and in no acute distress. Eyes: Conjunctivae are normal. PERRL. EOMI. Head: Atraumatic. Nose: No congestion/rhinnorhea.  No tenderness on percussion of the frontal or maxillary sinuses bilaterally. Neck: No stridor. Hematological/Lymphatic/Immunilogical: No cervical lymphadenopathy. Cardiovascular: Normal rate, regular rhythm. Grossly normal heart sounds.  Good peripheral circulation. Respiratory: Normal respiratory effort.  No retractions. Lungs CTAB. Skin:  Skin is warm, dry and intact. Psychiatric: Mood and affect are normal. Speech and behavior are normal.    FINAL CLINICAL IMPRESSION(S)   Sinus pressure.  Recent MRI showing chronic sinus changes as an incidental finding.   Patient will begin using Astelin nasal spray 2 sprays each nostril twice a day and also a tapering dose of prednisone starting with 60 mg and tapering down.  If this is not improving and he continues to have sinus issues we will refer to [facility].  I also explained to him the difference between a sinus infection and chronic sinus changes noted as an incidental finding  on his MRI.  I explained that he had no tenderness to percussion of his sinuses which if infected should have been tender.    ED Discharge Orders    None      Note:  This document was prepared using Dragon voice recognition software and may include unintentional dictation errors.

## 2020-07-15 DIAGNOSIS — Z03818 Encounter for observation for suspected exposure to other biological agents ruled out: Secondary | ICD-10-CM | POA: Diagnosis not present

## 2020-07-15 DIAGNOSIS — J0191 Acute recurrent sinusitis, unspecified: Secondary | ICD-10-CM | POA: Diagnosis not present

## 2020-08-09 ENCOUNTER — Other Ambulatory Visit: Payer: Self-pay

## 2020-08-09 ENCOUNTER — Ambulatory Visit: Payer: Self-pay

## 2020-08-09 ENCOUNTER — Other Ambulatory Visit: Payer: Self-pay | Admitting: Neurology

## 2020-08-09 DIAGNOSIS — Z01818 Encounter for other preprocedural examination: Secondary | ICD-10-CM

## 2020-08-09 LAB — POCT URINALYSIS DIPSTICK
Bilirubin, UA: NEGATIVE
Blood, UA: NEGATIVE
Glucose, UA: NEGATIVE
Leukocytes, UA: NEGATIVE
Nitrite, UA: NEGATIVE
Protein, UA: POSITIVE — AB
Spec Grav, UA: 1.015 (ref 1.010–1.025)
Urobilinogen, UA: 0.2 E.U./dL
pH, UA: 7 (ref 5.0–8.0)

## 2020-08-09 NOTE — Progress Notes (Signed)
Pt presents to complete physical with [facility],NP 08-25-20

## 2020-08-10 LAB — CMP12+LP+TP+TSH+6AC+CBC/D/PLT
ALT: 41 IU/L (ref 0–44)
AST: 19 IU/L (ref 0–40)
Albumin/Globulin Ratio: 1.6 (ref 1.2–2.2)
Albumin: 4.6 g/dL (ref 4.1–5.2)
Alkaline Phosphatase: 68 IU/L (ref 44–121)
BUN/Creatinine Ratio: 14 (ref 9–20)
BUN: 15 mg/dL (ref 6–20)
Basophils Absolute: 0 10*3/uL (ref 0.0–0.2)
Basos: 0 %
Bilirubin Total: 0.6 mg/dL (ref 0.0–1.2)
Calcium: 9.6 mg/dL (ref 8.7–10.2)
Chloride: 100 mmol/L (ref 96–106)
Chol/HDL Ratio: 5.4 ratio — ABNORMAL HIGH (ref 0.0–5.0)
Cholesterol, Total: 291 mg/dL — ABNORMAL HIGH (ref 100–199)
Creatinine, Ser: 1.04 mg/dL (ref 0.76–1.27)
EOS (ABSOLUTE): 0 10*3/uL (ref 0.0–0.4)
Eos: 1 %
Estimated CHD Risk: 1.1 times avg. — ABNORMAL HIGH (ref 0.0–1.0)
Free Thyroxine Index: 1.9 (ref 1.2–4.9)
GFR calc Af Amer: 111 mL/min/{1.73_m2} (ref >59)
GFR calc non Af Amer: 96 mL/min/{1.73_m2} (ref >59)
GGT: 40 IU/L (ref 0–65)
Globulin, Total: 2.8 g/dL (ref 1.5–4.5)
Glucose: 104 mg/dL — ABNORMAL HIGH (ref 65–99)
HDL: 54 mg/dL (ref >39)
Hematocrit: 46.5 % (ref 37.5–51.0)
Hemoglobin: 16 g/dL (ref 13.0–17.7)
Immature Grans (Abs): 0 10*3/uL (ref 0.0–0.1)
Immature Granulocytes: 0 %
Iron: 183 ug/dL — ABNORMAL HIGH (ref 38–169)
LDH: 185 IU/L (ref 121–224)
LDL Chol Calc (NIH): 180 mg/dL — ABNORMAL HIGH (ref 0–99)
Lymphocytes Absolute: 1.5 10*3/uL (ref 0.7–3.1)
Lymphs: 27 %
MCH: 30.8 pg (ref 26.6–33.0)
MCHC: 34.4 g/dL (ref 31.5–35.7)
MCV: 90 fL (ref 79–97)
Monocytes Absolute: 0.3 10*3/uL (ref 0.1–0.9)
Monocytes: 6 %
Neutrophils Absolute: 3.8 10*3/uL (ref 1.4–7.0)
Neutrophils: 66 %
Phosphorus: 2.8 mg/dL (ref 2.8–4.1)
Platelets: 218 10*3/uL (ref 150–450)
Potassium: 3.9 mmol/L (ref 3.5–5.2)
RBC: 5.19 x10E6/uL (ref 4.14–5.80)
RDW: 13 % (ref 11.6–15.4)
Sodium: 138 mmol/L (ref 134–144)
T3 Uptake Ratio: 28 % (ref 24–39)
T4, Total: 6.9 ug/dL (ref 4.5–12.0)
TSH: 0.693 u[IU]/mL (ref 0.450–4.500)
Total Protein: 7.4 g/dL (ref 6.0–8.5)
Triglycerides: 294 mg/dL — ABNORMAL HIGH (ref 0–149)
Uric Acid: 6.4 mg/dL (ref 3.8–8.4)
VLDL Cholesterol Cal: 57 mg/dL — ABNORMAL HIGH (ref 5–40)
WBC: 5.7 10*3/uL (ref 3.4–10.8)

## 2020-08-11 LAB — QUANTIFERON-TB GOLD PLUS
QuantiFERON Mitogen Value: >10 IU/mL
QuantiFERON Nil Value: 0.02 IU/mL
QuantiFERON TB1 Ag Value: 0.05 IU/mL
QuantiFERON TB2 Ag Value: 0.04 IU/mL
QuantiFERON-TB Gold Plus: NEGATIVE

## 2020-08-25 ENCOUNTER — Encounter: Payer: Self-pay | Admitting: Nurse Practitioner

## 2020-08-25 ENCOUNTER — Other Ambulatory Visit: Payer: Self-pay

## 2020-08-25 ENCOUNTER — Ambulatory Visit: Payer: Self-pay | Admitting: Nurse Practitioner

## 2020-08-25 VITALS — BP 134/81 | HR 86 | Temp 98.2°F | Resp 12 | Ht 71.0 in | Wt 195.0 lb

## 2020-08-25 DIAGNOSIS — Z0000 Encounter for general adult medical examination without abnormal findings: Secondary | ICD-10-CM

## 2020-08-25 DIAGNOSIS — K029 Dental caries, unspecified: Secondary | ICD-10-CM

## 2020-08-25 NOTE — Patient Instructions (Signed)
 Health Maintenance, Male Adopting a healthy lifestyle and getting preventive care are important in promoting health and wellness. Ask your health care provider about:  The right schedule for you to have regular tests and exams.  Things you can do on your own to prevent diseases and keep yourself healthy. What should I know about diet, weight, and exercise? Eat a healthy diet   Eat a diet that includes plenty of vegetables, fruits, low-fat dairy products, and lean protein.  Do not eat a lot of foods that are high in solid fats, added sugars, or sodium. Maintain a healthy weight Body mass index (BMI) is a measurement that can be used to identify possible weight problems. It estimates body fat based on height and weight. Your health care provider can help determine your BMI and help you achieve or maintain a healthy weight. Get regular exercise Get regular exercise. This is one of the most important things you can do for your health. Most adults should:  Exercise for at least 150 minutes each week. The exercise should increase your heart rate and make you sweat (moderate-intensity exercise).  Do strengthening exercises at least twice a week. This is in addition to the moderate-intensity exercise.  Spend less time sitting. Even light physical activity can be beneficial. Watch cholesterol and blood lipids Have your blood tested for lipids and cholesterol at 31 years of age, then have this test every 5 years. You may need to have your cholesterol levels checked more often if:  Your lipid or cholesterol levels are high.  You are older than 31 years of age.  You are at high risk for heart disease. What should I know about cancer screening? Many types of cancers can be detected early and may often be prevented. Depending on your health history and family history, you may need to have cancer screening at various ages. This may include screening for:  Colorectal cancer.  Prostate  cancer.  Skin cancer.  Lung cancer. What should I know about heart disease, diabetes, and high blood pressure? Blood pressure and heart disease  High blood pressure causes heart disease and increases the risk of stroke. This is more likely to develop in people who have high blood pressure readings, are of African descent, or are overweight.  Talk with your health care provider about your target blood pressure readings.  Have your blood pressure checked: ? Every 3-5 years if you are 18-39 years of age. ? Every year if you are 40 years old or older.  If you are between the ages of 65 and 75 and are a current or former smoker, ask your health care provider if you should have a one-time screening for abdominal aortic aneurysm (AAA). Diabetes Have regular diabetes screenings. This checks your fasting blood sugar level. Have the screening done:  Once every three years after age 45 if you are at a normal weight and have a low risk for diabetes.  More often and at a younger age if you are overweight or have a high risk for diabetes. What should I know about preventing infection? Hepatitis B If you have a higher risk for hepatitis B, you should be screened for this virus. Talk with your health care provider to find out if you are at risk for hepatitis B infection. Hepatitis C Blood testing is recommended for:  Everyone born from 1945 through 1965.  Anyone with known risk factors for hepatitis C. Sexually transmitted infections (STIs)  You should be screened each   year for STIs, including gonorrhea and chlamydia, if: ? You are sexually active and are younger than 31 years of age. ? You are older than 31 years of age and your health care provider tells you that you are at risk for this type of infection. ? Your sexual activity has changed since you were last screened, and you are at increased risk for chlamydia or gonorrhea. Ask your health care provider if you are at risk.  Ask your  health care provider about whether you are at high risk for HIV. Your health care provider may recommend a prescription medicine to help prevent HIV infection. If you choose to take medicine to prevent HIV, you should first get tested for HIV. You should then be tested every 3 months for as long as you are taking the medicine. Follow these instructions at home: Lifestyle  Do not use any products that contain nicotine or tobacco, such as cigarettes, e-cigarettes, and chewing tobacco. If you need help quitting, ask your health care provider.  Do not use street drugs.  Do not share needles.  Ask your health care provider for help if you need support or information about quitting drugs. Alcohol use  Do not drink alcohol if your health care provider tells you not to drink.  If you drink alcohol: ? Limit how much you have to 0-2 drinks a day. ? Be aware of how much alcohol is in your drink. In the [place] one drink equals one 12 oz bottle of beer (355 mL), one 5 oz glass of wine (148 mL), or one 1 oz glass of hard liquor (44 mL). General instructions  Schedule regular health, dental, and eye exams.  Stay current with your vaccines.  Tell your health care provider if: ? You often feel depressed. ? You have ever been abused or do not feel safe at home. Summary  Adopting a healthy lifestyle and getting preventive care are important in promoting health and wellness.  Follow your health care provider's instructions about healthy diet, exercising, and getting tested or screened for diseases.  Follow your health care provider's instructions on monitoring your cholesterol and blood pressure. This information is not intended to replace advice given to you by your health care provider. Make sure you discuss any questions you have with your health care provider. Document Revised: 09/11/2018 Document Reviewed: 09/11/2018 Elsevier Patient Education  2020 [employer]   Fat and Cholesterol  Restricted Eating Plan Eating a diet that limits fat and cholesterol may help lower your risk for heart disease and other conditions. Your body needs fat and cholesterol for basic functions, but eating too much of these things can be harmful to your health. Your health care provider may order lab tests to check your blood fat (lipid) and cholesterol levels. This helps your health care provider understand your risk for certain conditions and whether you need to make diet changes. Work with your health care provider or dietitian to make an eating plan that is right for you. Your plan includes:  Limit your fat intake to ______% or less of your total calories a day.  Limit your saturated fat intake to ______% or less of your total calories a day.  Limit the amount of cholesterol in your diet to less than _________mg a day.  Eat ___________ g of fiber a day. What are tips for following this plan? General guidelines   If you are overweight, work with your health care provider to lose weight safely. Losing just   5-10% of your body weight can improve your overall health and help prevent diseases such as diabetes and heart disease.  Avoid: ? Foods with added sugar. ? Fried foods. ? Foods that contain partially hydrogenated oils, including stick margarine, some tub margarines, cookies, crackers, and other baked goods.  Limit alcohol intake to no more than 1 drink a day for nonpregnant women and 2 drinks a day for men. One drink equals 12 oz of beer, 5 oz of wine, or 1 oz of hard liquor. Reading food labels  Check food labels for: ? Trans fats, partially hydrogenated oils, or high amounts of saturated fat. Avoid foods that contain saturated fat and trans fat. ? The amount of cholesterol in each serving. Try to eat no more than 200 mg of cholesterol each day. ? The amount of fiber in each serving. Try to eat at least 20-30 g of fiber each day.  Choose foods with healthy fats, such  as: ? Monounsaturated and polyunsaturated fats. These include olive and canola oil, flaxseeds, walnuts, almonds, and seeds. ? Omega-3 fats. These are found in foods such as salmon, mackerel, sardines, tuna, flaxseed oil, and ground flaxseeds.  Choose grain products that have whole grains. Look for the word "whole" as the first word in the ingredient list. Cooking  Cook foods using methods other than frying. Baking, boiling, grilling, and broiling are some healthy options.  Eat more home-cooked food and less restaurant, buffet, and fast food.  Avoid cooking using saturated fats. ? Animal sources of saturated fats include meats, butter, and cream. ? Plant sources of saturated fats include palm oil, palm kernel oil, and coconut oil. Meal planning   At meals, imagine dividing your plate into fourths: ? Fill one-half of your plate with vegetables and green salads. ? Fill one-fourth of your plate with whole grains. ? Fill one-fourth of your plate with lean protein foods.  Eat fish that is high in omega-3 fats at least two times a week.  Eat more foods that contain fiber, such as whole grains, beans, apples, broccoli, carrots, peas, and barley. These foods help promote healthy cholesterol levels in the blood. Recommended foods Grains  Whole grains, such as whole wheat or whole grain breads, crackers, cereals, and pasta. Unsweetened oatmeal, bulgur, barley, quinoa, or brown rice. Corn or whole wheat flour tortillas. Vegetables  Fresh or frozen vegetables (raw, steamed, roasted, or grilled). Green salads. Fruits  All fresh, canned (in natural juice), or frozen fruits. Meats and other protein foods  Ground beef (85% or leaner), grass-fed beef, or beef trimmed of fat. Skinless chicken or turkey. Ground chicken or turkey. Pork trimmed of fat. All fish and seafood. Egg whites. Dried beans, peas, or lentils. Unsalted nuts or seeds. Unsalted canned beans. Natural nut butters without added sugar  and oil. Dairy  Low-fat or nonfat dairy products, such as skim or 1% milk, 2% or reduced-fat cheeses, low-fat and fat-free ricotta or cottage cheese, or plain low-fat and nonfat yogurt. Fats and oils  Tub margarine without trans fats. Light or reduced-fat mayonnaise and salad dressings. Avocado. Olive, canola, sesame, or safflower oils. The items listed above may not be a complete list of recommended foods or beverages. Contact your dietitian for more options. Foods to avoid Grains  White bread. White pasta. White rice. Cornbread. Bagels, pastries, and croissants. Crackers and snack foods that contain trans fat and hydrogenated oils. Vegetables  Vegetables cooked in cheese, cream, or butter sauce. Fried vegetables. Fruits  Canned fruit in heavy   syrup. Fruit in cream or butter sauce. Fried fruit. Meats and other protein foods  Fatty cuts of meat. Ribs, chicken wings, bacon, sausage, bologna, salami, chitterlings, fatback, hot dogs, bratwurst, and packaged lunch meats. Liver and organ meats. Whole eggs and egg yolks. Chicken and turkey with skin. Fried meat. Dairy  Whole or 2% milk, cream, half-and-half, and cream cheese. Whole milk cheeses. Whole-fat or sweetened yogurt. Full-fat cheeses. Nondairy creamers and whipped toppings. Processed cheese, cheese spreads, and cheese curds. Beverages  Alcohol. Sugar-sweetened drinks such as sodas, lemonade, and fruit drinks. Fats and oils  Butter, stick margarine, lard, shortening, ghee, or bacon fat. Coconut, palm kernel, and palm oils. Sweets and desserts  Corn syrup, sugars, honey, and molasses. Candy. Jam and jelly. Syrup. Sweetened cereals. Cookies, pies, cakes, donuts, muffins, and ice cream. The items listed above may not be a complete list of foods and beverages to avoid. Contact your dietitian for more information. Summary  Your body needs fat and cholesterol for basic functions. However, eating too much of these things can be harmful  to your health.  Work with your health care provider and dietitian to follow a diet low in fat and cholesterol. Doing this may help lower your risk for heart disease and other conditions.  Choose healthy fats, such as monounsaturated and polyunsaturated fats, and foods high in omega-3 fatty acids.  Eat fiber-rich foods, such as whole grains, beans, peas, fruits, and vegetables.  Limit or avoid alcohol, fried foods, and foods high in saturated fats, partially hydrogenated oils, and sugar. This information is not intended to replace advice given to you by your health care provider. Make sure you discuss any questions you have with your health care provider. Document Revised: 08/31/2017 Document Reviewed: 06/05/2017 Elsevier Patient Education  2020 [employer] Inc.  American Heart Association (AHA) Exercise Recommendation  Being physically active is important to prevent heart disease and stroke, the nation's No. 1and No. 5killers. To improve overall cardiovascular health, we suggest at least 150 minutes per week of moderate exercise or 75 minutes per week of vigorous exercise (or a combination of moderate and vigorous activity). Thirty minutes a day, five times a week is an easy goal to remember. You will also experience benefits even if you divide your time into two or three segments of 10 to 15 minutes per day.  For people who would benefit from lowering their blood pressure or cholesterol, we recommend 40 minutes of aerobic exercise of moderate to vigorous intensity three to four times a week to lower the risk for heart attack and stroke.  Physical activity is anything that makes you move your body and burn calories.  This includes things like climbing stairs or playing sports. Aerobic exercises benefit your heart, and include walking, jogging, swimming or biking. Strength and stretching exercises are best for overall stamina and flexibility.  The simplest, positive change you can make to  effectively improve your heart health is to start walking. It's enjoyable, free, easy, social and great exercise. A walking program is flexible and boasts high success rates because people can stick with it. It's easy for walking to become a regular and satisfying part of life.   For Overall Cardiovascular Health:  At least 30 minutes of moderate-intensity aerobic activity at least 5 days per week for a total of 150  OR   At least 25 minutes of vigorous aerobic activity at least 3 days per week for a total of 75 minutes; or a combination of moderate- and   vigorous-intensity aerobic activity  AND   Moderate- to high-intensity muscle-strengthening activity at least 2 days per week for additional health benefits.  For Lowering Blood Pressure and Cholesterol  An average 40 minutes of moderate- to vigorous-intensity aerobic activity 3 or 4 times per week  What if I can't make it to the time goal? Something is always better than nothing! And everyone has to start somewhere. Even if you've been sedentary for years, today is the day you can begin to make healthy changes in your life. If you don't think you'll make it for 30 or 40 minutes, set a reachable goal for today. You can work up toward your overall goal by increasing your time as you get stronger. Don't let all-or-nothing thinking rob you of doing what you can every day.  Source:http://www.heart.org    

## 2020-08-25 NOTE — Progress Notes (Signed)
Subjective:     Patient ID: Mitchell Burke, male   DOB: Jul 12, 1991, 29 y.o.   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 He has been seeing a neurologist for sleep disorder. He denies any other problems at this time.   Past Medical History:  Diagnosis Date  . Anxiety   . Chronic pain    Suboxone used, prescribed by MD in [place]  . GERD (gastroesophageal reflux disease)    History reviewed. No pertinent surgical history. Current Outpatient Medications on File Prior to Visit  Medication Sig Dispense Refill  . traZODone (DESYREL) 50 MG tablet Take 1 tablet (50 mg total) by mouth at bedtime. 30 tablet 5  . azelastine (ASTELIN) 0.1 % nasal spray Place 2 sprays into both nostrils in the morning and at bedtime. Use in each nostril as directed (Patient not taking: Reported on 08/25/2020) 30 mL 2  . pramipexole (MIRAPEX) 0.5 MG tablet Take 1 tablet (0.5 mg total) by mouth at bedtime. (Patient not taking: Reported on 08/25/2020) 90 tablet 1  . predniSONE (DELTASONE) 10 MG tablet Take 6 tablets  today, on day 2 take 5 tablets, day 3 take 4 tablets, day 4 take 3 tablets, day 5 take  2 tablets and 1 tablet the last day (Patient not taking: Reported on 08/25/2020) 21 tablet 0   No current facility-administered medications on file prior to visit.   No Known Allergies   Review of Systems  Gastrointestinal:       Hx of GERD  Psychiatric/Behavioral: Positive for sleep disturbance.  All other systems reviewed and are negative.      Objective:   Physical Exam Vitals and nursing note reviewed.  Constitutional:      General: He is not in acute distress.    Appearance: Normal appearance.  HENT:     Head: Normocephalic.     Jaw: There is normal jaw occlusion.     Right Ear: Tympanic membrane, ear canal and external ear normal.     Left Ear: Tympanic membrane, ear canal and external ear normal.      Nose: Nose normal.     Mouth/Throat:     Lips: Pink.     Mouth: Mucous membranes are moist.     Dentition: Dental caries present.     Pharynx: Oropharynx is clear.      Comments: Large decayed tooth left lower molar Eyes:     General: Lids are normal.     Extraocular Movements: Extraocular movements intact.     Conjunctiva/sclera: Conjunctivae normal.     Pupils: Pupils are equal, round, and reactive to light.  Neck:     Thyroid: No thyroid mass or thyroid tenderness.     Vascular: Normal carotid pulses. No carotid bruit or JVD.     Trachea: Trachea normal.  Cardiovascular:     Rate and Rhythm: Normal rate and regular rhythm.     Heart sounds: No murmur heard.   Pulmonary:     Effort: Pulmonary effort is normal.     Breath sounds: Normal breath sounds and air entry.  Abdominal:     General: Abdomen is flat. Bowel sounds are normal.     Palpations: Abdomen is soft.     Tenderness: There is no abdominal tenderness. There is no right CVA tenderness or left CVA tenderness.  Musculoskeletal:        General: No swelling,  tenderness or deformity.     Cervical back: Normal range of motion.     Right lower leg: No edema.     Left lower leg: No edema.  Lymphadenopathy:     Cervical: No cervical adenopathy.  Skin:    General: Skin is warm and dry.  Neurological:     Mental Status: He is alert.     Sensory: Sensation is intact.     Motor: Motor function is intact.     Coordination: Romberg sign negative. Coordination normal. Finger-Nose-Finger Test and Heel to [facility] Test normal.     Gait: Gait is intact.     Deep Tendon Reflexes:     Reflex Scores:      Bicep reflexes are 2+ on the right side and 2+ on the left side.      Brachioradialis reflexes are 2+ on the right side and 2+ on the left side.      Patellar reflexes are 2+ on the right side and 2+ on the left side.    Comments: Ambulatory with steady gait, stands on one foot without difficulty, straight leg raises without pain.  Distal pulses 2+. Grips are equal, radial pulses 2+. Rapid alternating movements without difficulty.  Psychiatric:        Mood and Affect: Mood normal.        Behavior: Behavior normal.        Thought Content: Thought content normal.       Assessment:    1. Routine general medical examination at a health care facility  2. Dental caries     Plan:    Discussed with patient elevated LDL and Triglycerides and need for intervention. Patient request to try diet and return for f/u and if no improvement will consider medication. Discussed with the patient and all questioned fully answered. He will return if any problems arise. Will recheck urine next week due to being positive for protein.Patient reports he will be seeing a dentist to take care of the decayed tooth.

## 2020-08-27 DIAGNOSIS — R350 Frequency of micturition: Secondary | ICD-10-CM | POA: Diagnosis not present

## 2020-08-27 DIAGNOSIS — R1084 Generalized abdominal pain: Secondary | ICD-10-CM | POA: Diagnosis not present

## 2020-08-27 DIAGNOSIS — R109 Unspecified abdominal pain: Secondary | ICD-10-CM | POA: Diagnosis not present

## 2020-09-02 DIAGNOSIS — R399 Unspecified symptoms and signs involving the genitourinary system: Secondary | ICD-10-CM | POA: Diagnosis not present

## 2020-09-02 DIAGNOSIS — Z113 Encounter for screening for infections with a predominantly sexual mode of transmission: Secondary | ICD-10-CM | POA: Diagnosis not present

## 2020-09-02 DIAGNOSIS — N341 Nonspecific urethritis: Secondary | ICD-10-CM | POA: Diagnosis not present

## 2020-09-06 ENCOUNTER — Other Ambulatory Visit: Payer: Self-pay

## 2020-09-06 DIAGNOSIS — N50819 Testicular pain, unspecified: Secondary | ICD-10-CM | POA: Insufficient documentation

## 2020-09-06 DIAGNOSIS — F17210 Nicotine dependence, cigarettes, uncomplicated: Secondary | ICD-10-CM | POA: Insufficient documentation

## 2020-09-06 LAB — URINALYSIS, COMPLETE (UACMP) WITH MICROSCOPIC
Bacteria, UA: NONE SEEN
Bilirubin Urine: NEGATIVE
Glucose, UA: NEGATIVE mg/dL
Hgb urine dipstick: NEGATIVE
Ketones, ur: NEGATIVE mg/dL
Leukocytes,Ua: NEGATIVE
Nitrite: NEGATIVE
Protein, ur: NEGATIVE mg/dL
Specific Gravity, Urine: 1.02 (ref 1.005–1.030)
Squamous Epithelial / HPF: NONE SEEN (ref 0–5)
pH: 5 (ref 5.0–8.0)

## 2020-09-06 LAB — CBC
HCT: 46.6 % (ref 39.0–52.0)
Hemoglobin: 16 g/dL (ref 13.0–17.0)
MCH: 31.4 pg (ref 26.0–34.0)
MCHC: 34.3 g/dL (ref 30.0–36.0)
MCV: 91.4 fL (ref 80.0–100.0)
Platelets: 230 10*3/uL (ref 150–400)
RBC: 5.1 MIL/uL (ref 4.22–5.81)
RDW: 13.1 % (ref 11.5–15.5)
WBC: 6.5 10*3/uL (ref 4.0–10.5)
nRBC: 0 % (ref 0.0–0.2)

## 2020-09-06 LAB — COMPREHENSIVE METABOLIC PANEL
ALT: 38 U/L (ref 0–44)
AST: 27 U/L (ref 15–41)
Albumin: 4.7 g/dL (ref 3.5–5.0)
Alkaline Phosphatase: 62 U/L (ref 38–126)
Anion gap: 11 (ref 5–15)
BUN: 16 mg/dL (ref 6–20)
CO2: 25 mmol/L (ref 22–32)
Calcium: 9.5 mg/dL (ref 8.9–10.3)
Chloride: 102 mmol/L (ref 98–111)
Creatinine, Ser: 1.09 mg/dL (ref 0.61–1.24)
GFR, Estimated: >60 mL/min (ref >60)
Glucose, Bld: 113 mg/dL — ABNORMAL HIGH (ref 70–99)
Potassium: 3.5 mmol/L (ref 3.5–5.1)
Sodium: 138 mmol/L (ref 135–145)
Total Bilirubin: 0.6 mg/dL (ref 0.3–1.2)
Total Protein: 7.9 g/dL (ref 6.5–8.1)

## 2020-09-06 NOTE — Addendum Note (Signed)
Addended by: Gardner Candle on: 09/06/2020 08:12 AM   Modules accepted: Orders

## 2020-09-06 NOTE — ED Triage Notes (Signed)
Pt in with co suprapubic pain for 1 month, states has had urinary frequency but not recently. No discharge or fever.

## 2020-09-07 ENCOUNTER — Emergency Department
Admission: EM | Admit: 2020-09-07 | Discharge: 2020-09-07 | Disposition: A | Discharge status: Home / Self Care | Payer: 59 | Attending: Emergency Medicine | Admitting: Emergency Medicine

## 2020-09-07 DIAGNOSIS — N5089 Other specified disorders of the male genital organs: Secondary | ICD-10-CM

## 2020-09-07 MED ORDER — VALACYCLOVIR HCL 1 G PO TABS: 1000.0000 mg | ORAL_TABLET | Freq: Two times a day (BID) | ORAL | 0 refills | Status: AC

## 2020-09-07 NOTE — Discharge Instructions (Signed)
Please continue to take your doxycycline.  You are being discharged with a prescription for valacyclovir/Valtrex medication to help treat a potential viral infection causing her symptoms, such as herpes.  Please take Tylenol and ibuprofen/Advil for your pain.  It is safe to take them together, or to alternate them every few hours.  Take up to 1000mg  of Tylenol at a time, up to 4 times per day.  Do not take more than 4000 mg of Tylenol in 24 hours.  For ibuprofen, take 400-600 mg, 4-5 times per day.  Please follow-up with the urologist, have attached their phone number.  If you develop any fevers or inability to urinate with your symptoms, please return to the ED.

## 2020-09-07 NOTE — ED Provider Notes (Signed)
[facility] Emergency Department Provider Note ____________________________________________   First MD Initiated Contact with Patient 09/07/20 [phone]     (approximate)  I have reviewed the triage vital signs and the nursing notes.  HISTORY  Chief Complaint Abdominal Pain   HPI Mitchell Burke is a 29 y.o. malewho presents to the ED for evaluation of chronic abdominal pain.   Chart review indicates hx anxiety, chronic pain syndrome, GERD.   Patient visited ED with 1 month of intermittent sharp pains localized to the base of the shaft of his penis/bottom of his suprapubic abdomen.  He reports the pain is intermittent, sharp and radiates along the shaft of his penis.  He denies any lesions to this area, trauma, dysuria, urethral discharge, fever, emesis, diarrhea.  He reports being seen at a local clinic 2 times in the past 1 month, received an antibiotic a couple months ago that did not improve his symptoms.  And he is currently on a second antibiotic, doxycycline, and reports he has continued symptoms.  Reports previously having gonorrhea and Chlamydia testing done, which was negative as of 1 week ago for the same symptoms.  He reports his symptoms started after receiving oral sex from his male partner.  He denies additional sexual partners and she has no known genital or oral lesions that he can describe.  Past Medical History:  Diagnosis Date  . Anxiety   . Chronic pain    Suboxone used, prescribed by MD in [place]  . GERD (gastroesophageal reflux disease)     Patient Active Problem List   Diagnosis Date Noted  . UARS (upper airway resistance syndrome) 12/16/2019  . 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 Leg cramping 12/16/2019  . Choking 12/16/2019  . 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  Prior to Admission medications   Medication Sig Start Date End Date Taking? Authorizing Provider   azelastine (ASTELIN) 0.1 % nasal spray Place 2 sprays into both nostrils in the morning and at bedtime. Use in each nostril as directed Patient not taking: Reported on 08/25/2020 07/07/20   Tommi Rumps, PA-C  pramipexole (MIRAPEX) 0.5 MG tablet Take 1 tablet (0.5 mg total) by mouth at bedtime. Patient not taking: Reported on 08/25/2020 06/10/20   Dohmeier, Porfirio Mylar, MD  predniSONE (DELTASONE) 10 MG tablet Take 6 tablets  today, on day 2 take 5 tablets, day 3 take 4 tablets, day 4 take 3 tablets, day 5 take  2 tablets and 1 tablet the last day Patient not taking: Reported on 08/25/2020 07/07/20   Tommi Rumps, PA-C  traZODone (DESYREL) 50 MG tablet Take 1 tablet (50 mg total) by mouth at bedtime. 12/16/19   Dohmeier, Porfirio Mylar, MD  valACYclovir (VALTREX) 1000 MG tablet Take 1 tablet (1,000 mg total) by mouth 2 (two) times daily for 7 days. 09/07/20 09/14/20  Delton Prairie, MD    Allergies Patient has no known allergies.  No family history on file.  Social History Social History   Tobacco Use  . Smoking status: Current Some Day Smoker    Types: Cigarettes  . Smokeless tobacco: Never Used  Substance Use Topics  . Alcohol use: No  . Drug use: No    Comment: uses Suboxone    Review of Systems  Constitutional: No fever/chills Eyes: No visual changes. ENT: No sore throat. Cardiovascular: Denies chest pain. Respiratory: Denies shortness of breath. Gastrointestinal:  No nausea, no vomiting.  No diarrhea.  No constipation. Positive for lower abdominal/shaft of penis pain. Genitourinary: Negative for dysuria. Musculoskeletal: Negative for back pain. Skin: Negative for rash. Neurological: Negative for headaches, focal weakness or numbness.  ____________________________________________   PHYSICAL EXAM:  VITAL SIGNS: Vitals:   09/07/20 0103 09/07/20 0404  BP: (!) 133/93 128/82  Pulse: 78 80  Resp: 16 16  Temp:    SpO2: 100% 100%     Constitutional: Alert and oriented. Well  appearing and in no acute distress. Eyes: Conjunctivae are normal. PERRL. EOMI. Head: Atraumatic. Nose: No congestion/rhinnorhea. Mouth/Throat: Mucous membranes are moist.  Oropharynx non-erythematous. Neck: No stridor. No cervical spine tenderness to palpation. Cardiovascular: Normal rate, regular rhythm. Grossly normal heart sounds.  Good peripheral circulation. Respiratory: Normal respiratory effort.  No retractions. Lungs CTAB. Gastrointestinal: Soft , nondistended, nontender to palpation. No CVA tenderness.  Benign frontal abdominal exam.  Nontender suprapubic to deep palpation. GU: Circumcised genitalia.  No pustular or vesicular lesions.  No swelling or signs of trauma.  Does have some mild splotchy discoloration to glans penis.  Scrotum appears normal, nontender testicles bilaterally there is smooth without nodularity. No inguinal lesions, tenderness or lymphadenopathy. Musculoskeletal: No lower extremity tenderness nor edema.  No joint effusions. No signs of acute trauma. Neurologic:  Normal speech and language. No gross focal neurologic deficits are appreciated. No gait instability noted. Skin:  Skin is warm, dry and intact. No rash noted. Psychiatric: Mood and affect are normal. Speech and behavior are normal.  ____________________________________________   LABS (all labs ordered are listed, but only abnormal results are displayed)  Labs Reviewed  COMPREHENSIVE METABOLIC PANEL - Abnormal; Notable for the following components:      Result Value   Glucose, Bld 113 (*)    All other components within normal limits  URINALYSIS, COMPLETE (UACMP) WITH MICROSCOPIC - Abnormal; Notable for the following components:   Color, Urine YELLOW (*)    APPearance CLEAR (*)    All other components within normal limits  CBC   _____________________________________________   PROCEDURES and INTERVENTIONS  Procedure(s) performed (including Critical Care):  Procedures  Medications - No data  to display  ____________________________________________   MDM / ED COURSE   29 year old presents with continued chronic intermittent penile pain, possibly due to herpes, and amenable to outpatient management with urology follow-up.  Normal vitals on room air.  Exam is largely reassuring with benign abdomen and a well-appearing patient.  GU exam demonstrates some mild splotchy discoloration to glans penis, but no vesicular lesions or other discrete genital or inguinal lesions.  Patient is already had GC testing and is empirically treated for these I see no indication to repeat this.  Blood and urine testing is benign.  We discussed the possibility of viral etiology for symptoms, and empiric initiation of valacyclovir.  I return to follow-up with a urologist, and provided him referral to do so.  We discussed return precautions for the ED.  Patient is stable for discharge.     ____________________________________________   FINAL CLINICAL IMPRESSION(S) / ED DIAGNOSES  Final diagnoses:  Pain of male genitalia     ED Discharge Orders         Ordered    valACYclovir (VALTREX) 1000 MG tablet  2 times daily        09/07/20 0809           Cherith Tewell   Note:  This document was prepared using Dragon voice recognition software and may include unintentional dictation errors.   Delton Prairie, MD  09/07/20 0817  

## 2020-09-15 ENCOUNTER — Ambulatory Visit (INDEPENDENT_AMBULATORY_CARE_PROVIDER_SITE_OTHER): Payer: 59 | Admitting: Urology

## 2020-09-15 ENCOUNTER — Other Ambulatory Visit: Payer: Self-pay

## 2020-09-15 ENCOUNTER — Encounter: Payer: Self-pay | Admitting: Urology

## 2020-09-15 VITALS — BP 136/79 | HR 90 | Ht 71.0 in | Wt 193.7 lb

## 2020-09-15 DIAGNOSIS — R102 Pelvic and perineal pain: Secondary | ICD-10-CM | POA: Diagnosis not present

## 2020-09-15 MED ORDER — CELECOXIB 200 MG PO CAPS: 200.0000 mg | ORAL_CAPSULE | Freq: Every day | ORAL | 0 refills | Status: DC

## 2020-09-15 NOTE — Patient Instructions (Signed)
Can try Selsun Blue shampoo on the penis for dry flaky skin  Pelvic Pain, Male Pelvic pain is pain in your lower abdomen, below your belly button and between your hips. The pain may start suddenly (be acute), keep coming back (recur), or last a long time (become chronic). Pelvic pain that lasts longer than six months is considered chronic. There are many possible causes of pelvic pain. Sometimes, the cause is not known. Pelvic pain may affect your:  Prostate gland.  Urinary system.  Digestive tract.  Musculoskeletal system. Strained muscles or ligaments may cause pelvic pain. Follow these instructions at home:  Medicines  Take over-the-counter and prescription medicines only as told by your health care provider.  If you were prescribed an antibiotic medicine, take it as told by your health care provider. Do not stop taking the antibiotic even if you start to feel better. Managing pain, stiffness, and swelling   Take warm water baths (sitz baths). Sitz baths help with relaxing your pelvic floor muscles. ? For a sitz bath, the water only comes up to your hips and covers your buttocks. A sitz bath may done at home in a bathtub or with a portable sitz bath that fits over the toilet.  If directed, apply heat to the affected area before you exercise. Use the heat source that your health care provider recommends, such as a moist heat pack or a heating pad. ? Place a towel between your skin and the heat source. ? Leave the heat on for 20-30 minutes. ? Remove the heat if your skin turns bright red. This is especially important if you are unable to feel pain, heat, or cold. You may have a greater risk of getting burned. General instructions  Rest as told by your health care provider.  Keep a journal of your pelvic pain. Write down: ? When the pain started. ? Where the pain is located. ? What seems to make the pain better or worse. ? Any symptoms you have along with the pain.  Follow  your treatment plan as told by your health care provider. This may include: ? Pelvic physical therapy. ? Yoga, meditation, and exercise. ? Biofeedback. This process trains you to manage your body's response (physiological response) through breathing techniques and relaxation methods. You will work with a therapist while machines are used to monitor your physical symptoms. ? Acupuncture. This is a type of treatment that involves stimulating specific points on your body by inserting thin needles through your skin to treat pain.  Keep all follow-up visits as told by your health care provider. This is important. Contact a health care provider if:  Medicine does not help your pain.  Your pain comes back.  You have new symptoms.  You have a fever or chills.  You are constipated.  You have blood in your urine or stool.  You feel weak or light-headed. Get help right away if:  You have sudden severe pain.  Your pain steadily gets worse.  You have severe pain along with fever, nausea, vomiting, or excessive sweating. Summary  Pelvic pain is pain in your lower abdomen, below your belly button and between your hips. There are many possible causes of pelvic pain. Sometimes, the cause is not known.  Take over-the-counter and prescription medicines only as told by your health care provider. If you were prescribed an antibiotic medicine, take it as told by your health care provider. Do not stop taking the antibiotic even if you start to feel better.  Contact a health care provider if you have new or worsening symptoms.  Get help right away if you have severe pain along with fever, nausea, vomiting, or excessive sweating.  Keep all follow-up visits as told by your health care provider. This is important. This information is not intended to replace advice given to you by your health care provider. Make sure you discuss any questions you have with your health care provider. Document Revised:  02/06/2018 Document Reviewed: 02/06/2018 Elsevier Patient Education  2020 [employer].

## 2020-09-15 NOTE — Progress Notes (Signed)
   09/15/20 9:54 AM   [name] 09/24/1991 [id number]  CC: Penile and lower abdominal pain  HPI: I saw Mr. Mitchell Burke in urology clinic for penile and lower abdominal pain over the last month.  He is a 29 year old male with medical history notable for anxiety and chronic pain who reports 1 month of intermittent suprapubic and penile pain.  There are no aggravating or alleviating factors.  His pain is intermittent.  It started after an episode of oral sex.  Extensive work-up with STD testing and urinalysis is all been negative.  He was treated empirically by his PCP with doxycycline and ceftriaxone for possible STDs, as well as with acyclovir by the ED.  He did not notice any significant improvement in his symptoms on these medications.  He thinks his symptoms have improved slightly over the last few weeks.  He denies any dysuria, gross hematuria, urgency/frequency, or other urinary symptoms.  He has never had any penile lesions or sores.  He denies any prior urologic surgeries.  He had similar symptoms in November of last year and a CT abdomen pelvis with contrast was performed which showed no abnormalities.   PMH: Past Medical History:  Diagnosis Date  . Anxiety   . Chronic pain    Suboxone used, prescribed by MD in [place]  . GERD (gastroesophageal reflux disease)     Family History: No family history on file.  Social History:  reports that he has been smoking cigarettes. He has never used smokeless tobacco. He reports that he does not drink alcohol and does not use drugs.  Physical Exam: BP 136/79 (BP Location: Left Arm, Patient Position: Sitting, Cuff Size: Large)   Pulse 90   Ht 5\' 11"  (1.803 m)   Wt 193 lb 11.2 oz (87.9 kg)   BMI 27.02 kg/m    Constitutional:  Alert and oriented, No acute distress. Cardiovascular: No clubbing, cyanosis, or edema. Respiratory: Normal respiratory effort, no increased work of breathing. GI: Abdomen is soft, nontender,  nondistended, no abdominal masses GU: Circumcised phallus with no lesions, patent meatus, testicles 20 cc and descended bilaterally.  No tenderness throughout suprapubic area, penis or scrotum  Laboratory Data: Reviewed, see HPI  Pertinent Imaging: I personally reviewed the CT abdomen pelvis with contrast from 08/25/2019 that shows no urologic abnormalities.  Assessment & Plan:   He is a 29 year old male with suprapubic and penile pain of unclear etiology.  Extensive work-up with STD testing and urinalysis have all been benign, and he has been treated empirically with antibiotics without significant improvement.  Physical exam is benign.  We discussed pelvic pain and complex etiologies.  I recommended a 2-week course of Celebrex to see if this improves his penile discomfort.  I do not have a good explanation for his constellation of symptoms.  Behavioral strategies discussed Trial of Celebrex 200 mg x 2 weeks RTC with PA 1 month symptom check  36, MD 09/15/2020  [facility] [address] [place], [place] [phone]

## 2020-09-19 ENCOUNTER — Other Ambulatory Visit: Payer: Self-pay | Admitting: Neurology

## 2020-09-23 DIAGNOSIS — R1032 Left lower quadrant pain: Secondary | ICD-10-CM | POA: Diagnosis not present

## 2020-09-23 DIAGNOSIS — L819 Disorder of pigmentation, unspecified: Secondary | ICD-10-CM | POA: Diagnosis not present

## 2020-10-04 ENCOUNTER — Telehealth: Payer: Self-pay

## 2020-10-04 NOTE — Telephone Encounter (Signed)
Contacted Harles & he said he's not using this medication & didn't request the refill.  AMD

## 2020-10-11 ENCOUNTER — Ambulatory Visit: Payer: 59 | Admitting: Neurology

## 2020-10-15 ENCOUNTER — Other Ambulatory Visit: Payer: Self-pay | Admitting: Neurology

## 2020-10-18 NOTE — Progress Notes (Deleted)
10/19/2020 10:20 PM   Mitchell Burke [birth year] [id number]  Referring provider: No referring provider defined for this encounter.  No chief complaint on file.  Urological history 1. Penile pain - STI testing negative - Contrast CT 08/2019 no urological abnormalities    HPI: Mitchell Burke is a 30 y.o. male who presents today after a trial of Celebrex.  PMH: Past Medical History:  Diagnosis Date  . Anxiety   . Chronic pain    Suboxone used, prescribed by MD in [place]  . GERD (gastroesophageal reflux disease)     Surgical History: No past surgical history on file.  Home Medications:  Allergies as of 10/19/2020   No Known Allergies     Medication List       Accurate as of October 18, 2020 10:20 PM. If you have any questions, ask your nurse or doctor.        azelastine 0.1 % nasal spray Commonly known as: ASTELIN Place 2 sprays into both nostrils in the morning and at bedtime. Use in each nostril as directed   celecoxib 200 MG capsule Commonly known as: CeleBREX Take 1 capsule (200 mg total) by mouth daily.   pramipexole 0.5 MG tablet Commonly known as: Mirapex Take 1 tablet (0.5 mg total) by mouth at bedtime.   traZODone 50 MG tablet Commonly known as: DESYREL TAKE 1 TABLET BY MOUTH EVERYDAY AT BEDTIME       Allergies: No Known Allergies  Family History: No family history on file.  Social History:  reports that he has been smoking cigarettes. He has never used smokeless tobacco. He reports that he does not drink alcohol and does not use drugs.  ROS: Pertinent ROS in HPI  Physical Exam: There were no vitals taken for this visit.  Constitutional:  Well nourished. Alert and oriented, No acute distress. HEENT: [place] AT, moist mucus membranes.  Trachea midline, no masses. Cardiovascular: No clubbing, cyanosis, or edema. Respiratory: Normal respiratory effort, no increased work of breathing. GI: Abdomen is soft, non tender, non  distended, no abdominal masses. Liver and spleen not palpable.  No hernias appreciated.  Stool sample for occult testing is not indicated.   GU: No CVA tenderness.  No bladder fullness or masses.  Patient with circumcised/uncircumcised phallus. ***Foreskin easily retracted***  Urethral meatus is patent.  No penile discharge. No penile lesions or rashes. Scrotum without lesions, cysts, rashes and/or edema.  Testicles are located scrotally bilaterally. No masses are appreciated in the testicles. Left and right epididymis are normal. Rectal: Patient with  normal sphincter tone. Anus and perineum without scarring or rashes. No rectal masses are appreciated. Prostate is approximately *** grams, *** nodules are appreciated. Seminal vesicles are normal. Skin: No rashes, bruises or suspicious lesions. Lymph: No cervical or inguinal adenopathy. Neurologic: Grossly intact, no focal deficits, moving all 4 extremities. Psychiatric: Normal mood and affect.  Laboratory Data: Lab Results  Component Value Date   WBC 6.5 09/06/2020   HGB 16.0 09/06/2020   HCT 46.6 09/06/2020   MCV 91.4 09/06/2020   PLT 230 09/06/2020    Lab Results  Component Value Date   CREATININE 1.09 09/06/2020    Lab Results  Component Value Date   TESTOSTERONE 316 06/10/2020    Lab Results  Component Value Date   TSH 0.693 08/09/2020       Component Value Date/Time   CHOL 291 (H) 08/09/2020 0910   HDL 54 08/09/2020 0910   CHOLHDL 5.4 (H) 08/09/2020 0910  LDLCALC 180 (H) 08/09/2020 0910    Lab Results  Component Value Date   AST 27 09/06/2020   Lab Results  Component Value Date   ALT 38 09/06/2020    Urinalysis    Component Value Date/Time   COLORURINE YELLOW (A) 09/06/2020 2020   APPEARANCEUR CLEAR (A) 09/06/2020 2020   APPEARANCEUR Clear 11/10/2013 1944   LABSPEC 1.020 09/06/2020 2020   LABSPEC 1.017 11/10/2013 1944   PHURINE 5.0 09/06/2020 2020   GLUCOSEU NEGATIVE 09/06/2020 2020   GLUCOSEU  Negative 11/10/2013 1944   HGBUR NEGATIVE 09/06/2020 2020   BILIRUBINUR NEGATIVE 09/06/2020 2020   BILIRUBINUR negative 08/09/2020 1004   BILIRUBINUR Negative 11/10/2013 1944   KETONESUR NEGATIVE 09/06/2020 2020   PROTEINUR NEGATIVE 09/06/2020 2020   UROBILINOGEN 0.2 08/09/2020 1004   NITRITE NEGATIVE 09/06/2020 2020   LEUKOCYTESUR NEGATIVE 09/06/2020 2020   LEUKOCYTESUR Negative 11/10/2013 1944  I have reviewed the labs.   Pertinent Imaging: Narrative & Impression  CLINICAL DATA:  Nausea and vomiting with increased urinary frequency. Back pain for 4-5 days.  EXAM: CT ABDOMEN AND PELVIS WITH CONTRAST  TECHNIQUE: Multidetector CT imaging of the abdomen and pelvis was performed using the standard protocol following bolus administration of intravenous contrast.  CONTRAST:  OMNIPAQUE IOHEXOL 300 MG/ML  SOLN  COMPARISON:  06/15/2015  FINDINGS: LOWER CHEST: No basilar pleural or apical pericardial effusion.  HEPATOBILIARY: Normal hepatic contours. There is no intra- or extrahepatic biliary dilatation. The gallbladder is normal.  PANCREAS: Normal pancreatic contours without pancreatic ductal dilatation or peripancreatic fluid collection.  SPLEEN: Normal.  ADRENALS/URINARY TRACT:  --Adrenal glands: Normal.  --Right kidney/ureter: No hydronephrosis, nephroureterolithiasis or solid renal mass.  --Left kidney/ureter: No hydronephrosis, nephroureterolithiasis or solid renal mass.  --Urinary bladder: Normal for degree of distention  STOMACH/BOWEL:  --Stomach/Duodenum: There is no hiatal hernia. The duodenal course and caliber are normal.  --Small bowel: No dilatation or inflammation.  --Colon: No focal abnormality.  --Appendix: Normal.  VASCULAR/LYMPHATIC: Normal course and caliber of the major abdominal vessels. No abdominal or pelvic lymphadenopathy.  REPRODUCTIVE: Normal prostate size with symmetric seminal  vesicles.  MUSCULOSKELETAL. No bony spinal canal stenosis or focal osseous abnormality.  OTHER: None.  IMPRESSION: No acute abdominal or pelvic abnormality.   Electronically Signed   By: Deatra Robinson M.D.   On: 08/25/2019 00:17   I have independently reviewed the films.  See HPI.   Assessment & Plan:    1. Penile pain/Pelvic pain ***  No follow-ups on file.  These notes generated with voice recognition software. I apologize for typographical errors.  Michiel Cowboy, PA-C  [facility] [address]  [address] [place], [place] [address] [phone]

## 2020-10-19 ENCOUNTER — Ambulatory Visit: Payer: Self-pay | Admitting: Urology

## 2020-10-19 DIAGNOSIS — R102 Pelvic and perineal pain: Secondary | ICD-10-CM

## 2020-10-25 NOTE — Progress Notes (Signed)
10/26/2020 10:45 AM   Mitchell Burke 1990-10-28 [id number]  Referring provider: No referring provider defined for this encounter.  Chief Complaint  Patient presents with   Pelvic Pain   Urological history 1. Penile pain - STI testing negative - Contrast CT 08/2019 no urological abnormalities    HPI: Mitchell Burke is a 30 y.o. male who presents today after a trial of Celebrex.  He has not noted any improvement with the Celebrex.  He states since he has seen Dr. Richardo Hanks the 15 December, he has noted that his left testicle hangs lower and he has felt a knot in the left spermatic cord.  He states is mostly pronounced after he gets out of the shower.  He also has pain from the left suprapubic area down the left side of his penis into the left testicle.  He does not have pain during sexual activity or pain during ejaculation.  He has not noted any discoloration of his ejaculate fluid or hematospermia.  He has not had any difficulty with urination.  Patient denies any modifying or aggravating factors.  Patient denies any gross hematuria, dysuria or suprapubic/flank pain.  Patient denies any fevers, chills, nausea or vomiting.   He is concerned because the emergency room provider told him he may have herpes.  He has denied any herpes outbreaks since his symptoms first began.  PMH: Past Medical History:  Diagnosis Date   Anxiety    Chronic pain    Suboxone used, prescribed by MD in [place]   GERD (gastroesophageal reflux disease)     Surgical History: History reviewed. No pertinent surgical history.  Home Medications:  Allergies as of 10/26/2020   No Known Allergies     Medication List       Accurate as of October 26, 2020 10:45 AM. If you have any questions, ask your nurse or doctor.        STOP taking these medications   azelastine 0.1 % nasal spray Commonly known as: ASTELIN Stopped by: Allisen Pidgeon, PA-C   celecoxib 200 MG  capsule Commonly known as: CeleBREX Stopped by: Marcanthony Sleight, PA-C   pramipexole 0.5 MG tablet Commonly known as: Mirapex Stopped by: Vashti Bolanos, PA-C     TAKE these medications   traZODone 50 MG tablet Commonly known as: DESYREL TAKE 1 TABLET BY MOUTH EVERYDAY AT BEDTIME       Allergies: No Known Allergies  Family History: History reviewed. No pertinent family history.  Social History:  reports that he has been smoking cigarettes. He has never used smokeless tobacco. He reports that he does not drink alcohol and does not use drugs.  ROS: Pertinent ROS in HPI  Physical Exam: BP (!) 163/82    Pulse (!) 101    Ht 5\' 11"  (1.803 m)    Wt 196 lb 3.2 oz (89 kg)    BMI 27.36 kg/m   Constitutional:  Well nourished. Alert and oriented, No acute distress. HEENT: [place] AT, mask in place.  Trachea midline Cardiovascular: No clubbing, cyanosis, or edema. Respiratory: Normal respiratory effort, no increased work of breathing. GU: No CVA tenderness.  No bladder fullness or masses.  Patient with circumcised phallus.  Urethral meatus is patent.  No penile discharge. No penile lesions or rashes. Scrotum without lesions, cysts, rashes and/or edema.  Testicles are located scrotally bilaterally. No masses are appreciated in the testicles. Left and right epididymis are normal.  I did not appreciate the knot to the patient has  palpated and patient was unable to locate it during today's exam. Lymph: No cervical or inguinal adenopathy. Neurologic: Grossly intact, no focal deficits, moving all 4 extremities. Psychiatric: Normal mood and affect.  Laboratory Data: Lab Results  Component Value Date   WBC 6.5 09/06/2020   HGB 16.0 09/06/2020   HCT 46.6 09/06/2020   MCV 91.4 09/06/2020   PLT 230 09/06/2020    Lab Results  Component Value Date   CREATININE 1.09 09/06/2020    Lab Results  Component Value Date   TESTOSTERONE 316 06/10/2020    Lab Results  Component Value Date   TSH  0.693 08/09/2020       Component Value Date/Time   CHOL 291 (H) 08/09/2020 0910   HDL 54 08/09/2020 0910   CHOLHDL 5.4 (H) 08/09/2020 0910   LDLCALC 180 (H) 08/09/2020 0910    Lab Results  Component Value Date   AST 27 09/06/2020   Lab Results  Component Value Date   ALT 38 09/06/2020    Urinalysis    Component Value Date/Time   COLORURINE YELLOW (A) 09/06/2020 2020   APPEARANCEUR CLEAR (A) 09/06/2020 2020   APPEARANCEUR Clear 11/10/2013 1944   LABSPEC 1.020 09/06/2020 2020   LABSPEC 1.017 11/10/2013 1944   PHURINE 5.0 09/06/2020 2020   GLUCOSEU NEGATIVE 09/06/2020 2020   GLUCOSEU Negative 11/10/2013 1944   HGBUR NEGATIVE 09/06/2020 2020   BILIRUBINUR NEGATIVE 09/06/2020 2020   BILIRUBINUR negative 08/09/2020 1004   BILIRUBINUR Negative 11/10/2013 1944   KETONESUR NEGATIVE 09/06/2020 2020   PROTEINUR NEGATIVE 09/06/2020 2020   UROBILINOGEN 0.2 08/09/2020 1004   NITRITE NEGATIVE 09/06/2020 2020   LEUKOCYTESUR NEGATIVE 09/06/2020 2020   LEUKOCYTESUR Negative 11/10/2013 1944  I have reviewed the labs.   Pertinent Imaging: Narrative & Impression  CLINICAL DATA:  Nausea and vomiting with increased urinary frequency. Back pain for 4-5 days.  EXAM: CT ABDOMEN AND PELVIS WITH CONTRAST  TECHNIQUE: Multidetector CT imaging of the abdomen and pelvis was performed using the standard protocol following bolus administration of intravenous contrast.  CONTRAST:  OMNIPAQUE IOHEXOL 300 MG/ML  SOLN  COMPARISON:  06/15/2015  FINDINGS: LOWER CHEST: No basilar pleural or apical pericardial effusion.  HEPATOBILIARY: Normal hepatic contours. There is no intra- or extrahepatic biliary dilatation. The gallbladder is normal.  PANCREAS: Normal pancreatic contours without pancreatic ductal dilatation or peripancreatic fluid collection.  SPLEEN: Normal.  ADRENALS/URINARY TRACT:  --Adrenal glands: Normal.  --Right kidney/ureter: No hydronephrosis,  nephroureterolithiasis or solid renal mass.  --Left kidney/ureter: No hydronephrosis, nephroureterolithiasis or solid renal mass.  --Urinary bladder: Normal for degree of distention  STOMACH/BOWEL:  --Stomach/Duodenum: There is no hiatal hernia. The duodenal course and caliber are normal.  --Small bowel: No dilatation or inflammation.  --Colon: No focal abnormality.  --Appendix: Normal.  VASCULAR/LYMPHATIC: Normal course and caliber of the major abdominal vessels. No abdominal or pelvic lymphadenopathy.  REPRODUCTIVE: Normal prostate size with symmetric seminal vesicles.  MUSCULOSKELETAL. No bony spinal canal stenosis or focal osseous abnormality.  OTHER: None.  IMPRESSION: No acute abdominal or pelvic abnormality.   Electronically Signed   By: Deatra Robinson M.D.   On: 08/25/2019 00:17   I have independently reviewed the films.  See HPI.   Assessment & Plan:    1. Penile pain/Pelvic pain We will obtain a scrotal ultrasound to rule out any pathology, but I explained to the patient that I suspect that will be benign as his physical exam is again benign today.  If the scrotal ultrasound returns  negative, I will then refer him on to pelvic floor physical therapy as he has likely strained a ligament in his pelvic floor contributing to his symptoms.    Return for Scrotal ultrasound report.  These notes generated with voice recognition software. I apologize for typographical errors.  Michiel Cowboy, PA-C  [facility] [address]  [address] [place], [place] [address] [phone]

## 2020-10-26 ENCOUNTER — Encounter: Payer: Self-pay | Admitting: Urology

## 2020-10-26 ENCOUNTER — Ambulatory Visit (INDEPENDENT_AMBULATORY_CARE_PROVIDER_SITE_OTHER): Payer: 59 | Admitting: Urology

## 2020-10-26 ENCOUNTER — Other Ambulatory Visit: Payer: Self-pay

## 2020-10-26 VITALS — BP 163/82 | HR 101 | Ht 71.0 in | Wt 196.2 lb

## 2020-10-26 DIAGNOSIS — N50812 Left testicular pain: Secondary | ICD-10-CM | POA: Diagnosis not present

## 2020-11-09 ENCOUNTER — Ambulatory Visit
Admission: RE | Admit: 2020-11-09 | Discharge: 2020-11-09 | Disposition: A | Discharge status: Home / Self Care | Payer: 59 | Source: Ambulatory Visit | Attending: Urology | Admitting: Urology

## 2020-11-09 ENCOUNTER — Other Ambulatory Visit: Payer: Self-pay

## 2020-11-09 DIAGNOSIS — N433 Hydrocele, unspecified: Secondary | ICD-10-CM | POA: Diagnosis not present

## 2020-11-09 DIAGNOSIS — N50812 Left testicular pain: Secondary | ICD-10-CM | POA: Diagnosis not present

## 2020-11-15 DIAGNOSIS — Z113 Encounter for screening for infections with a predominantly sexual mode of transmission: Secondary | ICD-10-CM | POA: Diagnosis not present

## 2020-11-23 NOTE — Progress Notes (Unsigned)
11/24/2020 9:22 AM   Mitchell Burke 12/26/1990 [id number]  Referring provider: No referring provider defined for this encounter.  Chief Complaint  Patient presents with  . Pelvic Pain   Urological history 1. Penile pain - STI testing negative - Contrast CT 08/2019 no urological abnormalities   - failed NSAIDS  HPI: Mitchell Burke is a 30 y.o. male who presents today to discuss scrotal ultrasound results.    Scrotal ultrasound shows small bilateral hydroceles.    Patient went to urgent care at [facility] for STD testing on 11/15/2020.  Urine dip, chlamydia, gonorrhea, syphilis, HIV and HSV were all negative  He states he is no longer having pain in the testicle area.  He is now experiencing suprapubic pain,  dysuria and urgency to void after ejaculation.  He is also having pain throughout the urethra after ejaculation the last for several minutes.  Patient denies any modifying or aggravating factors.  Patient denies any gross hematuria, dysuria or suprapubic/flank pain.  Patient denies any fevers, chills, nausea or vomiting.    PMH: Past Medical History:  Diagnosis Date  . Anxiety   . Chronic pain    Suboxone used, prescribed by MD in [place]  . GERD (gastroesophageal reflux disease)     Surgical History: No past surgical history on file.  Home Medications:  Allergies as of 11/24/2020   No Known Allergies     Medication List       Accurate as of November 24, 2020  9:22 AM. If you have any questions, ask your nurse or doctor.        diazepam 10 MG tablet Commonly known as: Valium Take one tablet 30 minutes prior to the procedure Started by: SHANNON MCGOWAN, PA-C   traZODone 50 MG tablet Commonly known as: DESYREL TAKE 1 TABLET BY MOUTH EVERYDAY AT BEDTIME       Allergies: No Known Allergies  Family History: No family history on file.  Social History:  reports that he has been smoking cigarettes. He has never used smokeless tobacco.  He reports that he does not drink alcohol and does not use drugs.  ROS: Pertinent ROS in HPI  Physical Exam: BP 136/89   Pulse 73   Ht 6' (1.829 m)   Wt 194 lb (88 kg)   BMI 26.31 kg/m   Constitutional:  Well nourished. Alert and oriented, No acute distress. HEENT: [place] AT, mask in place.  Trachea midline Cardiovascular: No clubbing, cyanosis, or edema. Respiratory: Normal respiratory effort, no increased work of breathing. Neurologic: Grossly intact, no focal deficits, moving all 4 extremities. Psychiatric: Normal mood and affect.  Laboratory Data: Specimen:  Urine - Urine specimen obtained by clean catch procedure (specimen)  Ref Range & Units 8 d ago  Chlamydia by NAA Negative Negative   Gonococcus by NAA Negative Negative   Trich vag by NAA Negative Negative   Resulting Agency  [facility]   Narrative Performed by [facility] 1 Performed at: [address] [employer]  [address] [place], [place] [id number]  Lab Director: Jolene Schimke MD, Phone: [phone] Specimen Collected: 11/15/20 6:38 PM Last Resulted: 11/16/20 8:35 PM  Received From: [facility]  Result Received: 11/23/20 10:49 AM     Specimen:  Blood - Venous blood specimen (specimen)  Ref Range & Units 8 d ago  RPR Non Reactive Non Reactive   Resulting Agency  [facility]   Narrative Performed by [facility] Performed at: 01 - [facility]  [address]  [address] [place], [place] [id number]  Lab Director: Jolene Schimke MD, Phone: [phone] Specimen Collected: 11/15/20 6:00 PM Last Resulted: 11/16/20 7:38 AM  Received From: [facility]  Result Received: 11/23/20 10:49 AM   Specimen:  Blood - Venous blood specimen (specimen)  Ref Range & Units 8 d ago Comments  HIV Screen 4th Generation wRfx Non Reactive Non Reactive  HIV Negative  HIV-1/HIV-2 antibodies and HIV-1 p24 antigen were NOT detected.  There is no laboratory evidence of HIV infection.  Resulting Agency  [facility]    Narrative Performed by [employer]  1 Performed at: [address] [facility]  [address], [place], [place] [id number]  Lab Director: Jolene Schimke MD, Phone: [phone] Specimen Collected: 11/15/20 6:00 PM Last Resulted: 11/16/20 7:38 AM  Received From: [facility]  Result Received: 11/23/20 10:49 AM   Specimen:  Blood  Ref Range & Units 8 d ago Comments  HSV, IgM I/II Combination 0.00 - 0.90 Ratio <0.91                  Negative    <0.91                  Equivocal 0.91 - 1.09                  Positive    >1.09  Resulting Agency  [facility]    Narrative Performed by [facility] 1 Performed at: [address] [employer]  [address], [place], [place] [id number]  Lab Director: Jolene Schimke MD, Phone: [phone] Specimen Collected: 11/15/20 6:00 PM Last Resulted: 11/16/20 4:37 PM  Received From: [facility]  Result Received: 11/23/20 10:49 AM   Specimen:  Urine  Ref Range & Units 8 d ago  Color, UA Green, Yellow, Straw, Clear Yellow   Clarity, UA Clear Clear   Glucose, UA Negative, Trace Negative   Bilirubin, UA Negative Negative   Ketones, UA Negative Negative   Spec Grav, UA 1.010, 1.015, 1.020, 1.025, Unable to interpret due to interfering substance 1.020   Blood, UA Negative Negative   pH, UA 5.0, 5.5, 6.0, 6.5, 7.0, 7.5, 8.0, 4.5 6.0   Protein, UA Negative Negative   Urobilinogen, UA Negative Negative   Leukocytes, UA Negative Negative   Nitrite, UA Negative Negative   Specimen Collected: 11/15/20 5:55 PM Last Resulted: 11/15/20 5:55 PM  Received From: [facility]  Result Received: 11/23/20 10:49 AM    I have reviewed the labs.   Pertinent Imaging: CLINICAL DATA:  Left testicular pain for 2 months  EXAM: SCROTAL ULTRASOUND  DOPPLER ULTRASOUND OF THE TESTICLES  TECHNIQUE: Complete ultrasound examination of the testicles, epididymis, and other scrotal structures was performed. Color and spectral Doppler ultrasound were also utilized to  evaluate blood flow to the testicles.  COMPARISON:  None.  FINDINGS: Right testicle  Measurements: 4.9 x 2.3 x 3 cm. No mass or microlithiasis visualized.  Left testicle  Measurements: 5.1 x 2.1 x 2.9 cm. No mass or microlithiasis visualized.  Right epididymis:  Normal in size and appearance.  Left epididymis:  Normal in size and appearance.  Hydrocele:  Small bilateral hydroceles.  Varicocele:  None visualized.  Pulsed Doppler interrogation of both testes demonstrates normal low resistance arterial and venous waveforms bilaterally.  IMPRESSION: 1. No testicular torsion or testicular mass.   Electronically Signed   By: Elige Ko   On: 11/09/2020 15:01  have independently reviewed the films.  See HPI.   Assessment & Plan:    1. Penile pain/Pelvic pain -  scrotal ultrasound did not find an etiology for his pain - recent STI testing negative - testicular pain resolved  2. Dysuria -Occurring after ejaculation -Cultures have been negative -Schedule cystoscopy to rule out any surgical correctable causes for his symptoms - I have explained to the patient that they will  be scheduled for a cystoscopy in our office to evaluate their bladder.  The cystoscopy consists of passing a tube with a lens up through their urethra and into their urinary bladder.   We will inject the urethra with a lidocaine gel prior to introducing the cystoscope to help with any discomfort during the procedure.   After the procedure, they might experience blood in the urine and discomfort with urination.  This will abate after the first few voids.  I have  encouraged the patient to increase water intake  during this time.  Patient denies any allergies to lidocaine.  -given a Valium 10 mg to take prior to the procedure - advised to have a driver   Return for cysto for dysuria .  These notes generated with voice recognition software. I apologize for typographical errors.  Michiel Cowboy, PA-C  [facility] [address]  [address] [place], [place] [address] [phone]

## 2020-11-24 ENCOUNTER — Ambulatory Visit (INDEPENDENT_AMBULATORY_CARE_PROVIDER_SITE_OTHER): Payer: 59 | Admitting: Urology

## 2020-11-24 ENCOUNTER — Other Ambulatory Visit: Payer: Self-pay

## 2020-11-24 ENCOUNTER — Encounter: Payer: Self-pay | Admitting: Urology

## 2020-11-24 VITALS — BP 136/89 | HR 73 | Ht 72.0 in | Wt 194.0 lb

## 2020-11-24 DIAGNOSIS — R300 Dysuria: Secondary | ICD-10-CM

## 2020-11-24 DIAGNOSIS — R102 Pelvic and perineal pain: Secondary | ICD-10-CM

## 2020-11-24 MED ORDER — DIAZEPAM 10 MG PO TABS: ORAL_TABLET | ORAL | 0 refills | Status: DC

## 2020-11-24 NOTE — Patient Instructions (Signed)
Cystoscopy Cystoscopy is a procedure that is used to help diagnose and sometimes treat conditions that affect the lower urinary tract. The lower urinary tract includes the bladder and the urethra. The urethra is the tube that drains urine from the bladder. Cystoscopy is done using a thin, tube-shaped instrument with a light and camera at the end (cystoscope). The cystoscope may be hard or flexible, depending on the goal of the procedure. The cystoscope is inserted through the urethra, into the bladder. Cystoscopy may be recommended if you have:  Urinary tract infections that keep coming back.  Blood in the urine (hematuria).  An inability to control when you urinate (urinary incontinence) or an overactive bladder.  Unusual cells found in a urine sample.  A blockage in the urethra, such as a urinary stone.  Painful urination.  An abnormality in the bladder found during an intravenous pyelogram (IVP) or CT scan. Cystoscopy may also be done to remove a sample of tissue to be examined under a microscope (biopsy). What are the risks? Generally, this is a safe procedure. However, problems may occur, including:  Infection.  Bleeding.  What happens during the procedure?  1. You will be given one or more of the following: ? A medicine to numb the area (local anesthetic). 2. The area around the opening of your urethra will be cleaned. 3. The cystoscope will be passed through your urethra into your bladder. 4. Germ-free (sterile) fluid will flow through the cystoscope to fill your bladder. The fluid will stretch your bladder so that your health care provider can clearly examine your bladder walls. 5. Your doctor will look at the urethra and bladder. 6. The cystoscope will be removed The procedure may vary among health care providers  What can I expect after the procedure? After the procedure, it is common to have: 1. Some soreness or pain in your abdomen and urethra. 2. Urinary symptoms.  These include: ? Mild pain or burning when you urinate. Pain should stop within a few minutes after you urinate. This may last for up to 1 week. ? A small amount of blood in your urine for several days. ? Feeling like you need to urinate but producing only a small amount of urine. Follow these instructions at home: General instructions  Return to your normal activities as told by your health care provider.   Do not drive for 24 hours if you were given a sedative during your procedure.  Watch for any blood in your urine. If the amount of blood in your urine increases, call your health care provider.  If a tissue sample was removed for testing (biopsy) during your procedure, it is up to you to get your test results. Ask your health care provider, or the department that is doing the test, when your results will be ready.  Drink enough fluid to keep your urine pale yellow.  Keep all follow-up visits as told by your health care provider. This is important. Contact a health care provider if you:  Have pain that gets worse or does not get better with medicine, especially pain when you urinate.  Have trouble urinating.  Have more blood in your urine. Get help right away if you:  Have blood clots in your urine.  Have abdominal pain.  Have a fever or chills.  Are unable to urinate. Summary  Cystoscopy is a procedure that is used to help diagnose and sometimes treat conditions that affect the lower urinary tract.  Cystoscopy is done using   a thin, tube-shaped instrument with a light and camera at the end.  After the procedure, it is common to have some soreness or pain in your abdomen and urethra.  Watch for any blood in your urine. If the amount of blood in your urine increases, call your health care provider.  If you were prescribed an antibiotic medicine, take it as told by your health care provider. Do not stop taking the antibiotic even if you start to feel better. This  information is not intended to replace advice given to you by your health care provider. Make sure you discuss any questions you have with your health care provider. Document Revised: 09/10/2018 Document Reviewed: 09/10/2018 Elsevier Patient Education  2020 Elsevier Inc[employer]   

## 2020-11-25 ENCOUNTER — Other Ambulatory Visit: Payer: Self-pay

## 2020-11-25 VITALS — BP 130/90

## 2020-11-25 DIAGNOSIS — R809 Proteinuria, unspecified: Secondary | ICD-10-CM

## 2020-11-25 DIAGNOSIS — E782 Mixed hyperlipidemia: Secondary | ICD-10-CM

## 2020-11-25 LAB — POCT URINALYSIS DIPSTICK
Bilirubin, UA: NEGATIVE
Blood, UA: NEGATIVE
Glucose, UA: NEGATIVE
Ketones, UA: NEGATIVE
Leukocytes, UA: NEGATIVE
Nitrite, UA: NEGATIVE
Protein, UA: NEGATIVE
Spec Grav, UA: 1.015 (ref 1.010–1.025)
Urobilinogen, UA: 0.2 E.U./dL
pH, UA: 6 (ref 5.0–8.0)

## 2020-11-26 LAB — CMP12+LP+TP+TSH+6AC+CBC/D/PLT
ALT: 23 IU/L (ref 0–44)
AST: 22 IU/L (ref 0–40)
Albumin/Globulin Ratio: 1.6 (ref 1.2–2.2)
Albumin: 4.4 g/dL (ref 4.0–5.0)
Alkaline Phosphatase: 86 IU/L (ref 44–121)
BUN/Creatinine Ratio: 10 (ref 9–20)
BUN: 11 mg/dL (ref 6–20)
Basophils Absolute: 0 10*3/uL (ref 0.0–0.2)
Basos: 1 %
Bilirubin Total: 0.7 mg/dL (ref 0.0–1.2)
Calcium: 9.6 mg/dL (ref 8.7–10.2)
Chloride: 103 mmol/L (ref 96–106)
Chol/HDL Ratio: 4.1 ratio (ref 0.0–5.0)
Cholesterol, Total: 219 mg/dL — ABNORMAL HIGH (ref 100–199)
Creatinine, Ser: 1.13 mg/dL (ref 0.76–1.27)
EOS (ABSOLUTE): 0 10*3/uL (ref 0.0–0.4)
Eos: 0 %
Estimated CHD Risk: 0.8 times avg. (ref 0.0–1.0)
Free Thyroxine Index: 2 (ref 1.2–4.9)
GFR calc Af Amer: 100 mL/min/{1.73_m2} (ref >59)
GFR calc non Af Amer: 86 mL/min/{1.73_m2} (ref >59)
GGT: 28 IU/L (ref 0–65)
Globulin, Total: 2.7 g/dL (ref 1.5–4.5)
Glucose: 93 mg/dL (ref 65–99)
HDL: 54 mg/dL (ref >39)
Hematocrit: 49.3 % (ref 37.5–51.0)
Hemoglobin: 16.4 g/dL (ref 13.0–17.7)
Immature Grans (Abs): 0 10*3/uL (ref 0.0–0.1)
Immature Granulocytes: 0 %
Iron: 172 ug/dL — ABNORMAL HIGH (ref 38–169)
LDH: 189 IU/L (ref 121–224)
LDL Chol Calc (NIH): 134 mg/dL — ABNORMAL HIGH (ref 0–99)
Lymphocytes Absolute: 2.4 10*3/uL (ref 0.7–3.1)
Lymphs: 39 %
MCH: 30.4 pg (ref 26.6–33.0)
MCHC: 33.3 g/dL (ref 31.5–35.7)
MCV: 92 fL (ref 79–97)
Monocytes Absolute: 0.4 10*3/uL (ref 0.1–0.9)
Monocytes: 6 %
Neutrophils Absolute: 3.3 10*3/uL (ref 1.4–7.0)
Neutrophils: 54 %
Phosphorus: 3.1 mg/dL (ref 2.8–4.1)
Platelets: 247 10*3/uL (ref 150–450)
Potassium: 4.2 mmol/L (ref 3.5–5.2)
RBC: 5.39 x10E6/uL (ref 4.14–5.80)
RDW: 12.4 % (ref 11.6–15.4)
Sodium: 142 mmol/L (ref 134–144)
T3 Uptake Ratio: 28 % (ref 24–39)
T4, Total: 7.1 ug/dL (ref 4.5–12.0)
TSH: 0.821 u[IU]/mL (ref 0.450–4.500)
Total Protein: 7.1 g/dL (ref 6.0–8.5)
Triglycerides: 176 mg/dL — ABNORMAL HIGH (ref 0–149)
Uric Acid: 7.2 mg/dL (ref 3.8–8.4)
VLDL Cholesterol Cal: 31 mg/dL (ref 5–40)
WBC: 6.1 10*3/uL (ref 3.4–10.8)

## 2020-11-29 ENCOUNTER — Other Ambulatory Visit: Payer: Self-pay | Admitting: Neurology

## 2020-12-01 ENCOUNTER — Encounter: Payer: Self-pay | Admitting: Emergency Medicine

## 2020-12-01 ENCOUNTER — Ambulatory Visit: Payer: Self-pay | Admitting: Emergency Medicine

## 2020-12-01 ENCOUNTER — Other Ambulatory Visit: Payer: Self-pay

## 2020-12-01 VITALS — BP 135/85 | HR 85 | Temp 98.1°F | Resp 14 | Ht 74.0 in | Wt 195.0 lb

## 2020-12-01 DIAGNOSIS — L290 Pruritus ani: Secondary | ICD-10-CM

## 2020-12-01 MED ORDER — HYDROCORTISONE (PERIANAL) 2.5 % EX CREA: 1.0000 "application " | TOPICAL_CREAM | Freq: Two times a day (BID) | CUTANEOUS | 0 refills | Status: DC

## 2020-12-01 NOTE — Progress Notes (Signed)
I have reviewed the triage vital signs and the nursing notes.   HISTORY  Chief Complaint Follow-up (3 month Follow-up - Review lab results//)   HPI Mitchell Burke is a 30 y.o. male is here to discuss lab results from 2 weeks ago.  Was seen in November at which time his cholesterol, triglycerides, LDL were elevated.  Patient states he has not changed any diet habits but has been exercising more.  He also added a protein powder to his routine.  Unrelated to his visit he complains of some rectal itching which is worsened when he wipes with toilet paper.  Occasionally he sees some bright red blood but denies any other symptoms.  He has tried various over-the-counter medications without any improvement.       Past Medical History:  Diagnosis Date  . Anxiety   . Chronic pain    Suboxone used, prescribed by MD in [place]  . GERD (gastroesophageal reflux disease)     Patient Active Problem List   Diagnosis Date Noted  . UARS (upper airway resistance syndrome) 12/16/2019  . 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 Leg cramping 12/16/2019  . Choking 12/16/2019  . 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 No pertinent surgical history.  Prior to Admission medications   Medication Sig Start Date End Date Taking? Authorizing Provider  diazepam (VALIUM) 10 MG tablet Take one tablet 30 minutes prior to the procedure 11/24/20  Yes McGowan, Carollee Herter A, PA-C  hydrocortisone (ANUSOL-HC) 2.5 % rectal cream Place 1 application rectally 2 (two) times daily. 12/01/20  Yes Bridget Hartshorn L, PA-C  traZODone (DESYREL) 50 MG tablet TAKE 1 TABLET BY MOUTH EVERYDAY AT BEDTIME 10/19/20  Yes Dohmeier, Porfirio Mylar, MD    Allergies Patient has no known allergies.  History reviewed. No pertinent family history.  Social History Social History   Tobacco Use  . Smoking status: Current Some Day Smoker    Types: Cigarettes  . Smokeless tobacco: Never Used  Substance Use  Topics  . Alcohol use: No  . Drug use: No    Comment: uses Suboxone    Review of Systems Constitutional: No fever/chills Eyes: No visual changes. Cardiovascular: Denies chest pain. Respiratory: Denies shortness of breath. Gastrointestinal: No abdominal pain.  No nausea, no vomiting.  No diarrhea.  Positive rectal itching. Genitourinary: Negative for dysuria. Musculoskeletal: Negative for muscle skeletal pain. Skin: Negative for rash. Neurological: Negative for  focal weakness or numbness. ____________________________________________   PHYSICAL EXAM: Constitutional: Alert and oriented. Well appearing and in no acute distress. Eyes: Conjunctivae are normal.  Head: Atraumatic. Neck: No stridor.  Musculoskeletal: Moves upper and lower extremities without any difficulty.  Normal gait was noted. Neurologic:  Normal speech and language. No gross focal neurologic deficits are appreciated. No gait instability. Skin:  Skin is warm, dry and intact.  Psychiatric: Mood and affect are normal. Speech and behavior are normal.  ____________________________________________   LABS (all labs ordered are listed, but only abnormal results are displayed)  Discussed labs specifically cholesterol, triglycerides, HDL and LDL and improvement.  ____________________________________________   FINAL CLINICAL IMPRESSION(S)   Patient is here to discuss lab work that was done recently.  He was seen and had a physical in November at which time his lipid panel was elevated.  Patient states he has not changed his diet very much but has been exercising and also began with protein shakes.  Lab work is greatly improved in the last 3 months.  He is  aware that they are not completely normal but he has made great strides to improve his cholesterol, triglycerides and LDL which are all lower than they were in November.  A prescription for Anusol HC cream was sent to the pharmacy for him to try to see if this helps with  rectal itching.  He is aware that he should only use small amount and can use it twice a day.  He was warned not to use it continuously as it can cause skin thinning.  He will return if any continued problems.   ED Discharge Orders         Ordered    hydrocortisone (ANUSOL-HC) 2.5 % rectal cream  2 times daily        12/01/20 1608          Note:  This document was prepared using Dragon voice recognition software and may include unintentional dictation errors.

## 2020-12-02 ENCOUNTER — Ambulatory Visit: Payer: 59

## 2020-12-09 ENCOUNTER — Ambulatory Visit (INDEPENDENT_AMBULATORY_CARE_PROVIDER_SITE_OTHER): Payer: 59 | Admitting: Urology

## 2020-12-09 ENCOUNTER — Encounter: Payer: Self-pay | Admitting: Urology

## 2020-12-09 ENCOUNTER — Other Ambulatory Visit: Payer: Self-pay

## 2020-12-09 VITALS — BP 153/80 | HR 102 | Ht 74.0 in | Wt 195.0 lb

## 2020-12-09 DIAGNOSIS — R300 Dysuria: Secondary | ICD-10-CM

## 2020-12-09 DIAGNOSIS — R102 Pelvic and perineal pain: Secondary | ICD-10-CM

## 2020-12-09 LAB — URINALYSIS, COMPLETE
Bilirubin, UA: NEGATIVE
Glucose, UA: NEGATIVE
Ketones, UA: NEGATIVE
Leukocytes,UA: NEGATIVE
Nitrite, UA: NEGATIVE
Protein,UA: NEGATIVE
Specific Gravity, UA: 1.025 (ref 1.005–1.030)
Urobilinogen, Ur: 0.2 mg/dL (ref 0.2–1.0)
pH, UA: 6 (ref 5.0–7.5)

## 2020-12-09 LAB — MICROSCOPIC EXAMINATION
Bacteria, UA: NONE SEEN
RBC, Urine: NONE SEEN /hpf (ref 0–2)
WBC, UA: NONE SEEN /hpf (ref 0–5)

## 2020-12-09 MED ORDER — LIDOCAINE HCL URETHRAL/MUCOSAL 2 % EX GEL
1.0000 "application " | Freq: Once | CUTANEOUS | Status: AC
  Administered 2020-12-09: 1 via URETHRAL

## 2020-12-09 NOTE — Progress Notes (Signed)
Cystoscopy Procedure Note:  Indication: Pelvic pain/dysuria  After informed consent and discussion of the procedure and its risks, Andrea Colglazier was positioned and prepped in the standard fashion. Cystoscopy was performed with a flexible cystoscope.  The urethra was widely patent and normal-appearing without any suspicious lesions or narrowing.  He had an extreme amount of discomfort just with looking in the urethra, and I did not think he would tolerate passing the scope through the prostate and into the bladder and the procedure was terminated.  Very low suspicion for any bladder pathology.  Findings: Normal urethra  Assessment and Plan: Patient with numerous urologic complaints including penile pain, low pelvic pain, dysuria, and urethral pain.  Extensive work-up with STD testing, urinalysis, scrotal ultrasound, CT, now cystoscopy has all been completely benign.  He failed a 2-week trial of NSAIDs.  I suspect a component of anxiety and chronic pain are playing a large role in his urinary symptoms.  Referral placed to pelvic floor physical therapy  Legrand Rams, MD 12/09/2020

## 2020-12-09 NOTE — Patient Instructions (Signed)
Pelvic Pain, Male Pelvic pain is pain in your lower abdomen, below your belly button and between your hips. The pain may start suddenly (be acute), keep coming back (recur), or last a long time (become chronic). Pelvic pain that lasts longer than six months is considered chronic. There are many possible causes of pelvic pain. Sometimes, the cause is not known. Pelvic pain may affect your:  Prostate gland.  Urinary system.  Digestive tract.  Musculoskeletal system. Strained muscles or ligaments may cause pelvic pain. Follow these instructions at home: Medicines  Take over-the-counter and prescription medicines only as told by your health care provider.  If you were prescribed an antibiotic medicine, take it as told by your health care provider. Do not stop taking the antibiotic even if you start to feel better. Managing pain, stiffness, and swelling  Take warm water baths (sitz baths). Sitz baths help with relaxing your pelvic floor muscles. ? For a sitz bath, the water only comes up to your hips and covers your buttocks. A sitz bath may done at home in a bathtub or with a portable sitz bath that fits over the toilet.  If directed, apply heat to the affected area before you exercise. Use the heat source that your health care provider recommends, such as a moist heat pack or a heating pad. ? Place a towel between your skin and the heat source. ? Leave the heat on for 20-30 minutes. ? Remove the heat if your skin turns bright red. This is especially important if you are unable to feel pain, heat, or cold. You may have a greater risk of getting burned.   General instructions  Rest as told by your health care provider.  Keep a journal of your pelvic pain. Write down: ? When the pain started. ? Where the pain is located. ? What seems to make the pain better or worse. ? Any symptoms you have along with the pain.  Follow your treatment plan as told by your health care provider. This may  include: ? Pelvic physical therapy. ? Yoga, meditation, and exercise. ? Biofeedback. This process trains you to manage your body's response (physiological response) through breathing techniques and relaxation methods. You will work with a therapist while machines are used to monitor your physical symptoms. ? Acupuncture. This is a type of treatment that involves stimulating specific points on your body by inserting thin needles through your skin to treat pain.  Keep all follow-up visits as told by your health care provider. This is important.   Contact a health care provider if:  Medicine does not help your pain.  Your pain comes back.  You have new symptoms.  You have a fever or chills.  You are constipated.  You have blood in your urine or stool.  You feel weak or light-headed. Get help right away if:  You have sudden severe pain.  Your pain steadily gets worse.  You have severe pain along with fever, nausea, vomiting, or excessive sweating. Summary  Pelvic pain is pain in your lower abdomen, below your belly button and between your hips. There are many possible causes of pelvic pain. Sometimes, the cause is not known.  Take over-the-counter and prescription medicines only as told by your health care provider. If you were prescribed an antibiotic medicine, take it as told by your health care provider. Do not stop taking the antibiotic even if you start to feel better.  Contact a health care provider if you have new or   worsening symptoms.  Get help right away if you have severe pain along with fever, nausea, vomiting, or excessive sweating.  Keep all follow-up visits as told by your health care provider. This is important. This information is not intended to replace advice given to you by your health care provider. Make sure you discuss any questions you have with your health care provider. Document Revised: 02/06/2018 Document Reviewed: 02/06/2018 Elsevier Patient  Education  2021 Elsevier Inc.  

## 2020-12-13 ENCOUNTER — Telehealth: Payer: Self-pay | Admitting: Urology

## 2020-12-13 NOTE — Telephone Encounter (Signed)
See my chart message

## 2020-12-13 NOTE — Telephone Encounter (Signed)
Pt called stating he wasn't able to finish or complete his Cysto exam last week due to pain, pt wants to know if there is another way ie "sedation" to complete the Cysto and try again. Please advise.

## 2020-12-13 NOTE — Telephone Encounter (Signed)
We were evaluating his urethra for any stricture and this was all normal. Would not recommend any additional procedures or repeat cysto  Legrand Rams, MD 12/13/2020

## 2021-02-28 ENCOUNTER — Telehealth: Payer: 59 | Admitting: Nurse Practitioner

## 2021-02-28 DIAGNOSIS — R300 Dysuria: Secondary | ICD-10-CM

## 2021-02-28 NOTE — Progress Notes (Signed)
Based on what you shared with me, I feel your condition warrants further evaluation and I recommend that you be seen for a face to face office visit.  Male bladder infections are not very common.  We worry about prostate or kidney conditions.  The standard of care is to examine the abdomen and kidneys, and to do a urine and blood test to make sure that something more serious is not going on.  We recommend that you see a provider today.  If your doctor's office is closed [place] has the following Urgent Cares:    NOTE: If you entered your credit card information for this eVisit, you will not be charged. You may see a "hold" on your card for the $35 but that hold will drop off and you will not have a charge processed.   If you are having a true medical emergency please call 911.     For an urgent face to face visit, [place] has five urgent care centers for your convenience:     [facility] [place] Get Driving Directions [phone] [address] [place], [place] [address] . 10 am - 6pm Monday - Friday    [facility] ([place]) Get Driving Directions [phone] [address] [place], [place] [address] . 10 am to 8 pm Monday-Friday . 12 pm to 8 pm Saturday-Sunday  [facility] ([place]) Get Driving Directions [phone]  [address] ,  [place]  [address] . 8 am to 8 pm Monday-Friday . 8 am to 4 pm Saturday-Sunday  [facility] Get Driving Directions [phone] [address], [address] [place], [place] [address] . 8 am to 8 pm Monday-Friday . 9 am to 6 pm Saturday . 11 am to 6 pm Sunday   [facility] [place] Get Driving Directions  [phone] [address] [place], [place] [address] . 8 am to 8 pm Monday-Friday . 8 am to 4 pm Saturday-Sunday   [facility]  at [place] Get Driving Directions [phone] [address] [place], [place] [address] . 12 pm to 6 pm Monday-Friday    VIDEO VISITS:  is now providing on-demand video visits for your convenience. Speak to one of our providers from the comfort of your home 8 am to 8 pm or after hours through our partner vendor.   For Video Visit details and options:   [email or web address]      Your MyChart E-visit questionnaire answers were reviewed by a board certified advanced clinical practitioner to complete your personal care plan based on your specific symptoms.  Thank you for using e-Visits.       

## 2021-03-11 ENCOUNTER — Ambulatory Visit: Payer: Self-pay | Admitting: Urology

## 2021-03-14 NOTE — Progress Notes (Signed)
03/15/2021 2:56 PM   [name] April 11, 1991 [id number]  Referring provider: No referring provider defined for this encounter.  Urological history: 1. Pelvic pain -CT scan 2016 and 2020 NED -scrotal ultrasound 2022 NED -cysto urethra 2022 NED -referred to PT  Chief Complaint  Patient presents with   Follow-up    HPI: Mitchell Burke is a 30 y.o.  male who presents today for 3 month follow up.       He continues to experience the suprapubic pain that causes urgency, but it has reduced in intensity and frequency.    He is taking OTC prostate medication that seems to help.  He did have an episode of urethral pain that was self-limiting two weeks ago.   He has not seen PT as of this appointment.    Patient denies any modifying or aggravating factors.  Patient denies any gross hematuria, dysuria or suprapubic/flank pain.  Patient denies any fevers, chills, nausea or vomiting.    He states about a month ago after intercourse he noted the area behind the coronal ridge on the dorsal side was hard even when he became flaccid.  It does not cause curvature and it is not painful at this time.  PMH: Past Medical History:  Diagnosis Date   Anxiety    Chronic pain    Suboxone used, prescribed by MD in [place]   GERD (gastroesophageal reflux disease)     Surgical History: History reviewed. No pertinent surgical history.  Home Medications:  Allergies as of 03/15/2021   No Known Allergies      Medication List        Accurate as of March 15, 2021  2:56 PM. If you have any questions, ask your nurse or doctor.          STOP taking these medications    diazepam 10 MG tablet Commonly known as: Valium Stopped by: Michiel Cowboy, PA-C       TAKE these medications    hydrocortisone 2.5 % rectal cream Commonly known as: Anusol-HC Place 1 application rectally 2 (two) times daily.   traZODone 50 MG tablet Commonly known as: DESYREL TAKE 1 TABLET BY  MOUTH EVERYDAY AT BEDTIME        Allergies: No Known Allergies  Family History: History reviewed. No pertinent family history.  Social History:  reports that he has been smoking cigarettes. He has never used smokeless tobacco. He reports that he does not drink alcohol and does not use drugs.  ROS: Pertinent ROS in HPI  Physical Exam: BP (!) 151/81   Pulse 81   Ht 6\' 2"  (1.88 m)   Wt 202 lb (91.6 kg)   BMI 25.94 kg/m   Constitutional:  Well nourished. Alert and oriented, No acute distress. HEENT: [place] AT, mask in place.  Trachea midline Cardiovascular: No clubbing, cyanosis, or edema. Respiratory: Normal respiratory effort, no increased work of breathing. GU: No CVA tenderness.  No bladder fullness or masses.  Patient with circumcised phallus.  Urethral meatus is patent.  No penile discharge. No penile lesions or rashes. A small Peyronie's plaque is noted behind the coronal ridge.   Neurologic: Grossly intact, no focal deficits, moving all 4 extremities. Psychiatric: Normal mood and affect.   Laboratory Data: Lab Results  Component Value Date   WBC 6.1 11/25/2020   HGB 16.4 11/25/2020   HCT 49.3 11/25/2020   MCV 92 11/25/2020   PLT 247 11/25/2020    Lab Results  Component Value Date  CREATININE 1.13 11/25/2020    Lab Results  Component Value Date   TESTOSTERONE 316 06/10/2020    Lab Results  Component Value Date   TSH 0.821 11/25/2020       Component Value Date/Time   CHOL 219 (H) 11/25/2020 0826   HDL 54 11/25/2020 0826   CHOLHDL 4.1 11/25/2020 0826   LDLCALC 134 (H) 11/25/2020 0826    Lab Results  Component Value Date   AST 22 11/25/2020   Lab Results  Component Value Date   ALT 23 11/25/2020    Urinalysis    Component Value Date/Time   COLORURINE YELLOW (A) 09/06/2020 2020   APPEARANCEUR Clear 12/09/2020 0854   LABSPEC 1.020 09/06/2020 2020   LABSPEC 1.017 11/10/2013 1944   PHURINE 5.0 09/06/2020 2020   GLUCOSEU Negative 12/09/2020  0854   GLUCOSEU Negative 11/10/2013 1944   HGBUR NEGATIVE 09/06/2020 2020   BILIRUBINUR Negative 12/09/2020 0854   BILIRUBINUR Negative 11/10/2013 1944   KETONESUR NEGATIVE 09/06/2020 2020   PROTEINUR Negative 12/09/2020 0854   PROTEINUR NEGATIVE 09/06/2020 2020   UROBILINOGEN 0.2 11/25/2020 0837   NITRITE Negative 12/09/2020 0854   NITRITE NEGATIVE 09/06/2020 2020   LEUKOCYTESUR Negative 12/09/2020 0854   LEUKOCYTESUR NEGATIVE 09/06/2020 2020   LEUKOCYTESUR Negative 11/10/2013 1944  I have reviewed the labs.   Pertinent Imaging: No new imaging since last visit   Assessment & Plan:    1. Pelvic/Penile pain -We discussed keeping a food, beverage, activity and symptom diary to see if we can discover an irritant that is contributing to his symptoms -Encourage patient to reach out to physical therapy and schedule appointments  2. Peyronie's plaque -Pathophysiology of Peyronie's disease is discussed with patient -Advised him to start taking vitamin E 400 international units daily  3. Unwanted fertility -Patient will be scheduled with Dr. Richardo Hanks for vasectomy consult  Return in about 3 months (around 06/15/2021) for recheck .  These notes generated with voice recognition software. I apologize for typographical errors.  Michiel Cowboy, PA-C  [facility] [address]  [address] [place], [place] [address] [phone]

## 2021-03-15 ENCOUNTER — Encounter: Payer: Self-pay | Admitting: Urology

## 2021-03-15 ENCOUNTER — Other Ambulatory Visit: Payer: Self-pay

## 2021-03-15 ENCOUNTER — Ambulatory Visit (INDEPENDENT_AMBULATORY_CARE_PROVIDER_SITE_OTHER): Payer: 59 | Admitting: Urology

## 2021-03-15 VITALS — BP 151/81 | HR 81 | Ht 74.0 in | Wt 202.0 lb

## 2021-03-15 DIAGNOSIS — N486 Induration penis plastica: Secondary | ICD-10-CM | POA: Diagnosis not present

## 2021-03-15 DIAGNOSIS — Z3009 Encounter for other general counseling and advice on contraception: Secondary | ICD-10-CM

## 2021-03-15 DIAGNOSIS — R102 Pelvic and perineal pain: Secondary | ICD-10-CM | POA: Diagnosis not present

## 2021-04-06 ENCOUNTER — Encounter: Payer: Self-pay | Admitting: Urology

## 2021-04-06 ENCOUNTER — Ambulatory Visit: Payer: Self-pay | Admitting: Urology

## 2021-04-14 ENCOUNTER — Other Ambulatory Visit: Payer: Self-pay | Admitting: Physician Assistant

## 2021-04-14 ENCOUNTER — Ambulatory Visit: Payer: Self-pay | Admitting: Physician Assistant

## 2021-04-14 ENCOUNTER — Encounter: Payer: Self-pay | Admitting: Physician Assistant

## 2021-04-14 VITALS — Temp 98.0°F | Resp 14 | Ht 73.0 in | Wt 202.0 lb

## 2021-04-14 DIAGNOSIS — Z1152 Encounter for screening for COVID-19: Secondary | ICD-10-CM

## 2021-04-14 DIAGNOSIS — J0100 Acute maxillary sinusitis, unspecified: Secondary | ICD-10-CM

## 2021-04-14 LAB — POC COVID19 BINAXNOW: SARS Coronavirus 2 Ag: NEGATIVE

## 2021-04-14 MED ORDER — FEXOFENADINE-PSEUDOEPHED ER 60-120 MG PO TB12: 1.0000 | ORAL_TABLET | Freq: Two times a day (BID) | ORAL | 0 refills | Status: DC

## 2021-04-14 MED ORDER — AMOXICILLIN 875 MG PO TABS: 875.0000 mg | ORAL_TABLET | Freq: Two times a day (BID) | ORAL | 0 refills | Status: AC

## 2021-04-14 MED ORDER — METHYLPREDNISOLONE 4 MG PO TBPK: ORAL_TABLET | ORAL | 0 refills | Status: DC

## 2021-04-14 NOTE — Progress Notes (Signed)
   Subjective: Sinusitis    Patient ID: Mitchell Burke, male    DOB: Jun 17, 1991, 30 y.o.   MRN: [id number]  HPI Patient presents with 3 weeks of sinus congestion with intermitting rhinorrhea.  Denies fever associated with complaint.  Patient had a negative COVID test this morning.  States no improvement over-the-counter medications.   Review of Systems Negative except for complaint    Objective:   Physical Exam No acute distress.  HEENT remarkable bilateral maxillary guarding.  Edematous nasal turbinates.  Thick nasal drainage.  Postnasal drainage. Neck is supple without adenopathy or bruits.  Lungs clear to auscultation.  Heart regular rate and rhythm.     Assessment & Plan: Subacute maxillary sinusitis  Patient given a prescription for amoxicillin, Allegra-D, Medrol Dosepak.  Advised to follow-up if no improvement in 3 to 5 days.

## 2021-04-14 NOTE — Progress Notes (Signed)
Pt presents today with sinus infection x3 weeks. Pt received rapid covid before entering clinic Resulted: Negative

## 2021-05-08 IMAGING — US US SCROTUM W/ DOPPLER COMPLETE
1 series · 14 of 25 positions shown · non-contrast
Comparison: None.

CLINICAL DATA: Left testicular pain for 2 months

EXAM:
SCROTAL ULTRASOUND
DOPPLER ULTRASOUND OF THE TESTICLES
TECHNIQUE: Complete ultrasound examination of the testicles, epididymis, and
other scrotal structures was performed. Color and spectral Doppler
ultrasound were also utilized to evaluate blood flow to the
testicles.

[Series 1: us scrotum w/doppler · 14 of 59 slices shown]
[im 1/59]
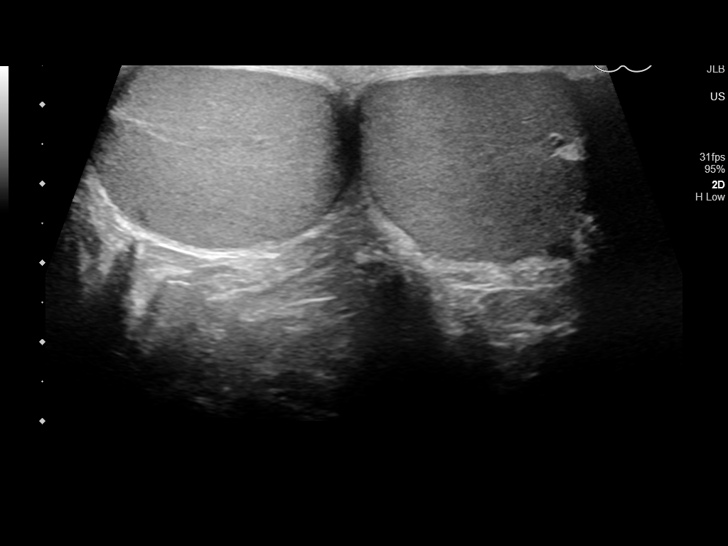
[im 5/59]
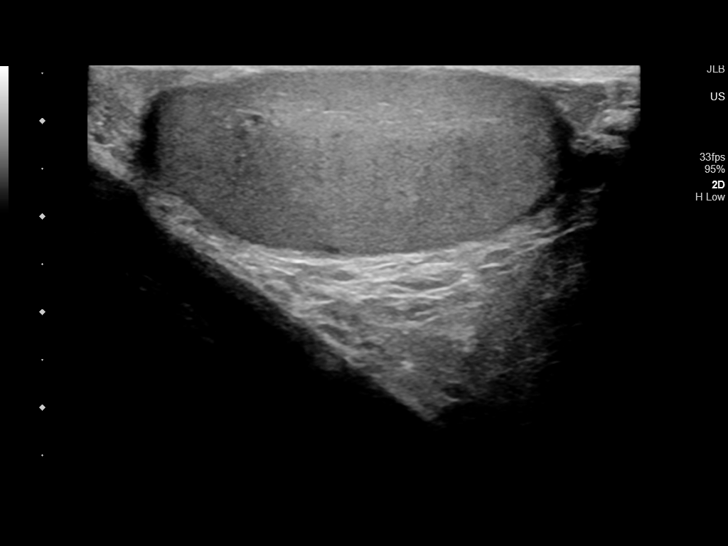
[im 10/59]
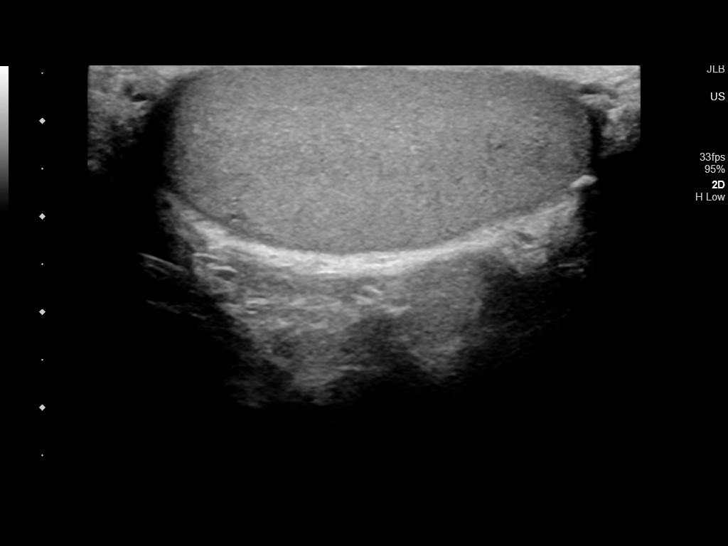
[im 15/59]
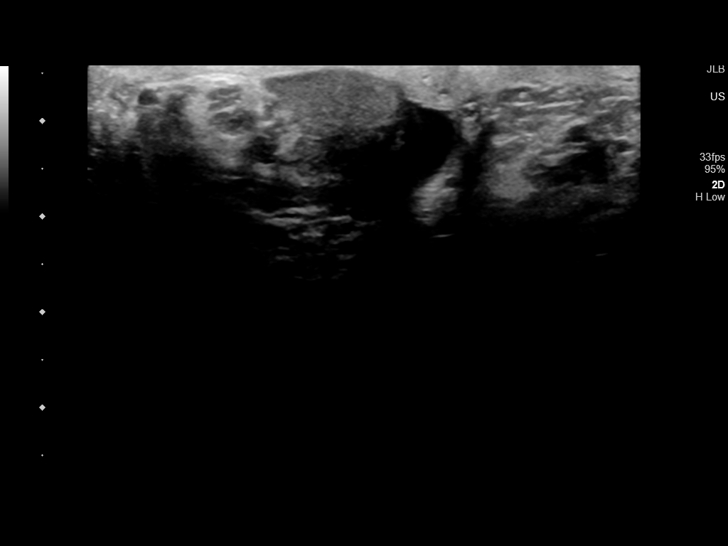
[im 20/59]
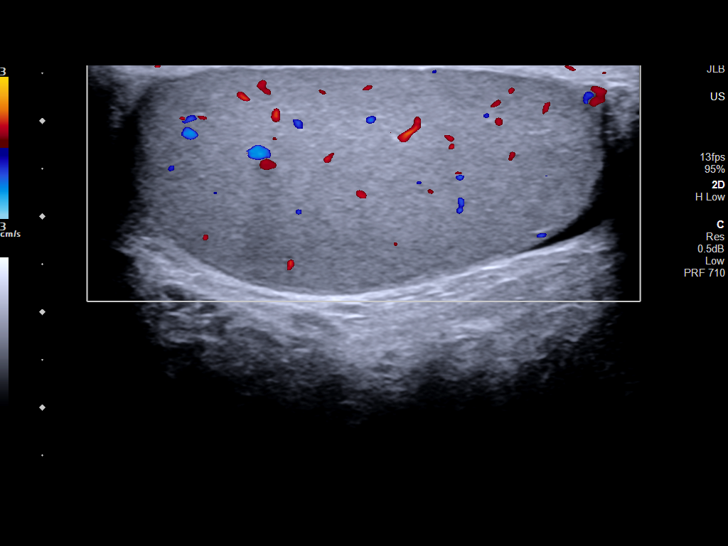
[im 22/59]
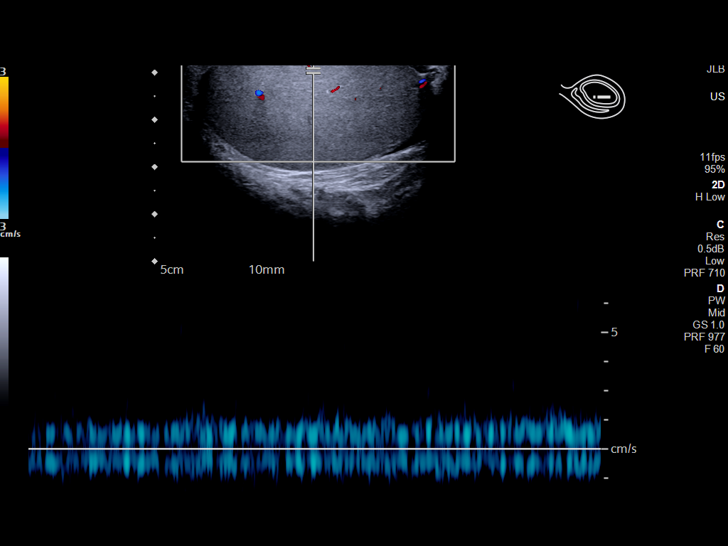
[im 27/59]
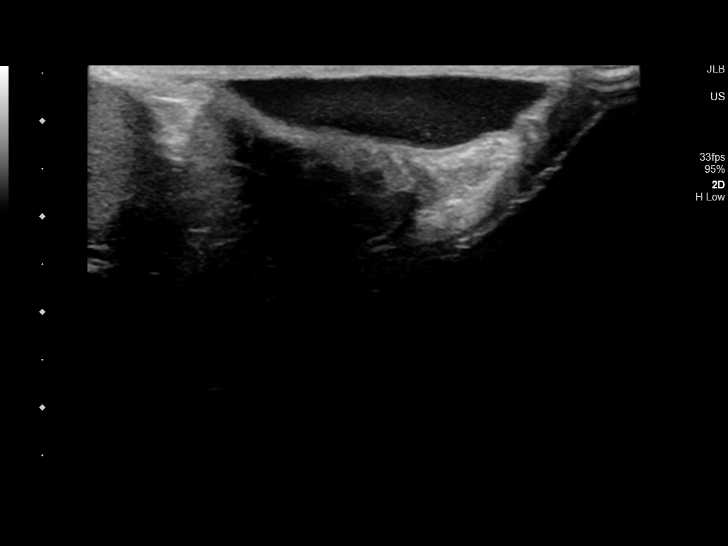
[im 32/59]
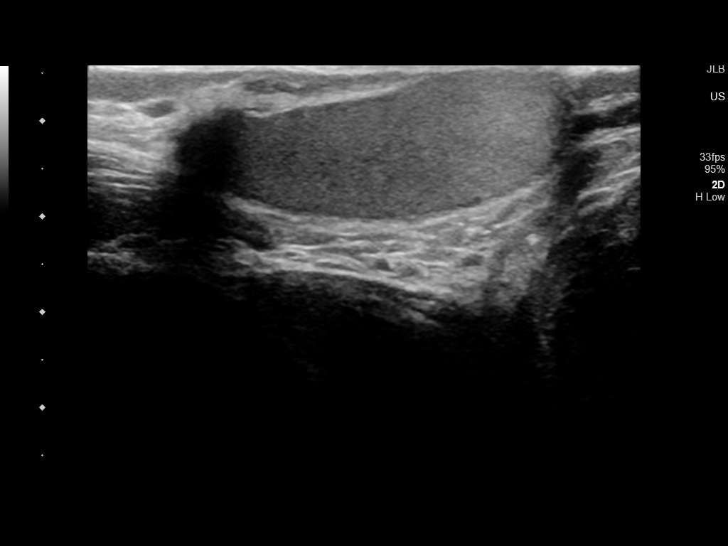
[im 37/59]
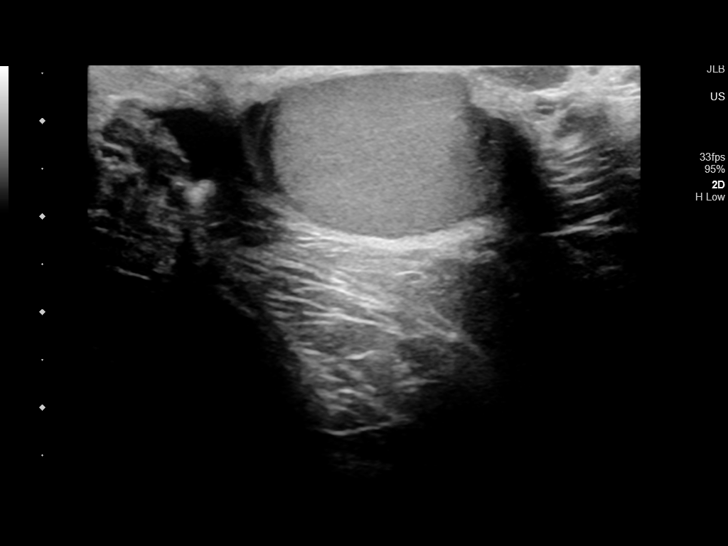
[im 39/59]
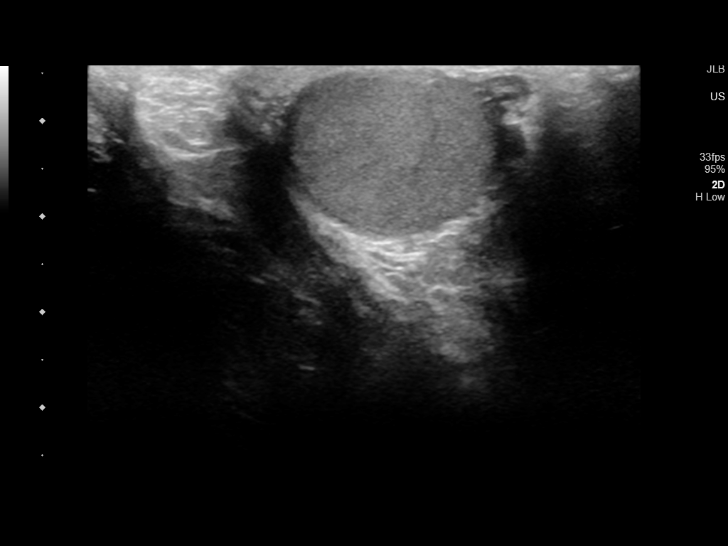
[im 44/59]
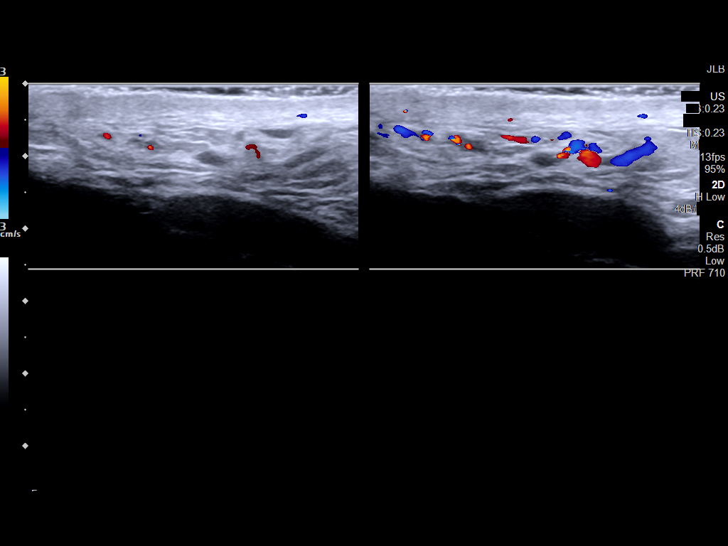
[im 49/59]
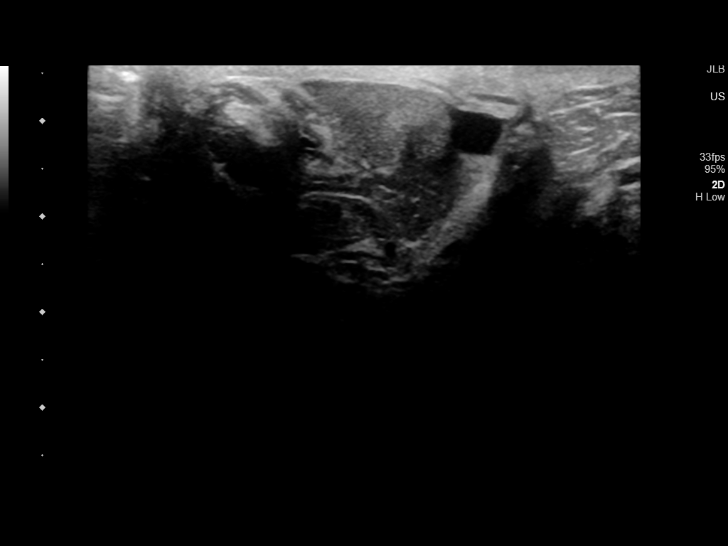
[im 54/59]
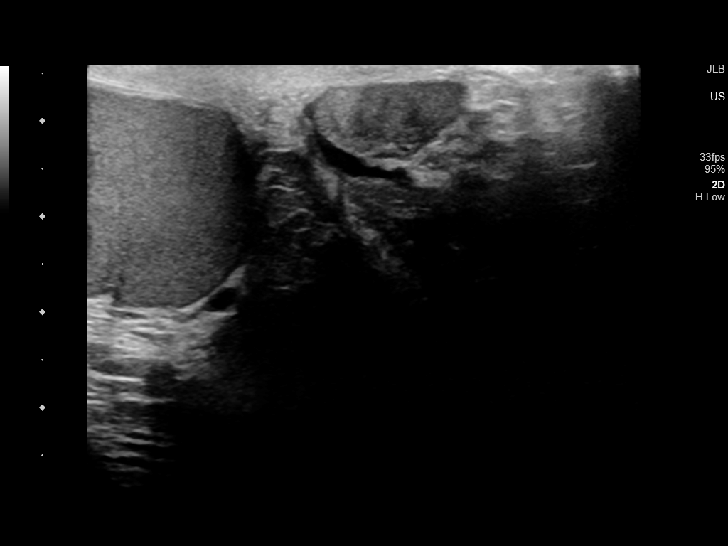
[im 59/59]
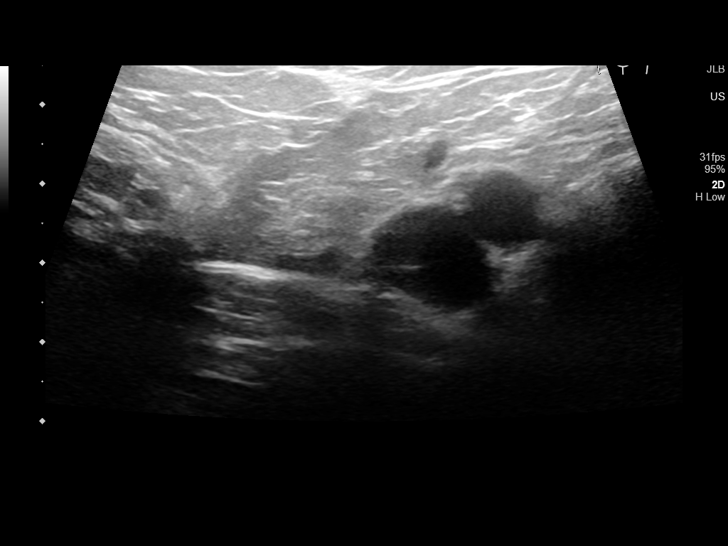

[14 of 25 positions shown; findings below may reference images not displayed]

FINDINGS: Right testicle

Measurements: 4.9 x 2.3 x 3 cm. No mass or microlithiasis
visualized.

Left testicle

Measurements: 5.1 x 2.1 x 2.9 cm. No mass or microlithiasis
visualized.

Right epididymis:  Normal in size and appearance.

Left epididymis:  Normal in size and appearance.

Hydrocele:  Small bilateral hydroceles.

Varicocele:  None visualized.

Pulsed Doppler interrogation of both testes demonstrates normal low
resistance arterial and venous waveforms bilaterally.
IMPRESSION: 1. No testicular torsion or testicular mass.

## 2021-05-14 ENCOUNTER — Other Ambulatory Visit: Payer: Self-pay

## 2021-05-14 ENCOUNTER — Emergency Department
Admission: EM | Admit: 2021-05-14 | Discharge: 2021-05-14 | Disposition: A | Discharge status: Home / Self Care | Payer: 59 | Attending: Emergency Medicine | Admitting: Emergency Medicine

## 2021-05-14 DIAGNOSIS — L509 Urticaria, unspecified: Secondary | ICD-10-CM | POA: Diagnosis not present

## 2021-05-14 DIAGNOSIS — F17210 Nicotine dependence, cigarettes, uncomplicated: Secondary | ICD-10-CM | POA: Diagnosis not present

## 2021-05-14 DIAGNOSIS — R21 Rash and other nonspecific skin eruption: Secondary | ICD-10-CM | POA: Diagnosis present

## 2021-05-14 MED ORDER — PREDNISONE 20 MG PO TABS
60.0000 mg | ORAL_TABLET | Freq: Once | ORAL | Status: AC
  Administered 2021-05-14: 60 mg via ORAL
  Filled 2021-05-14: qty 3

## 2021-05-14 MED ORDER — PREDNISONE 20 MG PO TABS: 60.0000 mg | ORAL_TABLET | Freq: Every day | ORAL | 0 refills | Status: AC

## 2021-05-14 MED ORDER — HYDROCORTISONE 1 % EX OINT: 1.0000 "application " | TOPICAL_OINTMENT | Freq: Two times a day (BID) | CUTANEOUS | 0 refills | Status: DC

## 2021-05-14 NOTE — ED Provider Notes (Signed)
[facility] Emergency Department Provider Note  ____________________________________________  Time seen: Approximately 2:46 AM  I have reviewed the triage vital signs and the nursing notes.   HISTORY  Chief Complaint Rash   HPI Mitchell Burke is a 30 y.o. male with no significant past medical history who presents for evaluation of a rash.  Patient is here with his son who has similar rash.  Patient reports that they were harvesting mangoes in [place] and came into contact with the sap from the tree.  Did develop itchy hives that started 9 days ago.  The hives have been in different locations in his body, pruritic, blanching.  Does not involve the palms and soles or mucous membranes.  No fever or chills.  They received a shot of hydrocortisone and Decadron while in [place] with no significant relief.  Patient denies any prior history of allergic reaction.  Denies any throat closing sensation, angioedema, vomiting or diarrhea, chest pain or shortness of breath.  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  Prior to Admission medications   Medication Sig Start Date End Date Taking? Authorizing Provider  hydrocortisone 1 % ointment Apply 1 application topically 2 (two) times daily. 05/14/21  Yes Don Perking, [place], [place]  predniSONE (DELTASONE) 20 MG tablet Take 3 tablets (60 mg total) by mouth daily with breakfast for 4 days. 05/14/21 05/18/21 Yes Candee Hoon, [place], MD  fexofenadine-pseudoephedrine (ALLEGRA-D) 60-120 MG 12 hr tablet Take 1 tablet by mouth 2 (two) times  daily. 04/14/21   Joni Reining, PA-C  traZODone (DESYREL) 50 MG tablet TAKE 1 TABLET BY MOUTH EVERYDAY AT BEDTIME 10/19/20   Dohmeier, Porfirio Mylar, MD    Allergies Patient has no known allergies.  No family history on file.  Social History Social History   Tobacco Use   Smoking status: Some Days    Types: Cigarettes   Smokeless tobacco: Never  Substance Use Topics   Alcohol use: No   Drug use: No    Comment: uses Suboxone    Review of Systems  Constitutional: Negative for fever. Eyes: Negative for visual changes. ENT: Negative for sore throat. Neck: No neck pain  Cardiovascular: Negative for chest pain. Respiratory: Negative for shortness of breath. Gastrointestinal: Negative for abdominal pain, vomiting or diarrhea. Genitourinary: Negative for dysuria. Musculoskeletal: Negative for back pain. Skin: + rash. Neurological: Negative for headaches, weakness or numbness. Psych: No SI or HI  ____________________________________________   PHYSICAL EXAM:  VITAL SIGNS: ED Triage Vitals  Enc Vitals Group     BP 05/14/21 0023 (!) 151/86     Pulse Rate 05/14/21 0023 77     Resp 05/14/21 0023 16     Temp 05/14/21 0023 98.4 F (36.9 C)     Temp Source 05/14/21 0023 Oral     SpO2 05/14/21 0023 100 %     Weight 05/14/21 0024 201 lb 15.1 oz (91.6 kg)     Height 05/14/21 0024 6\' 1"  (1.854 m)     Head Circumference --      Peak Flow --  Pain Score 05/14/21 0024 0     Pain Loc --      Pain Edu? --      Excl. in GC? --     Constitutional: Alert and oriented. Well appearing and in no apparent distress. HEENT:      Head: Normocephalic and atraumatic.         Eyes: Conjunctivae are normal. Sclera is non-icteric.       Mouth/Throat: Mucous membranes are moist.  No angioedema      Neck: Supple with no signs of meningismus. Cardiovascular: Regular rate and rhythm.  Respiratory: Normal respiratory effort. Lungs are clear to auscultation bilaterally.  Gastrointestinal: Soft,  non tender. Musculoskeletal:  No edema, cyanosis, or erythema of extremities. Neurologic: Normal speech and language. Face is symmetric. Moving all extremities. No gross focal neurologic deficits are appreciated. Skin: Skin is warm, dry and intact. Diffuse hives on torso, limbs, does not involve soles or palms or mucous members Psychiatric: Mood and affect are normal. Speech and behavior are normal.  ____________________________________________   LABS (all labs ordered are listed, but only abnormal results are displayed)  Labs Reviewed - No data to display ____________________________________________  EKG  none  ____________________________________________  RADIOLOGY  none  ____________________________________________   PROCEDURES  Procedure(s) performed: None Procedures Critical Care performed:  None ____________________________________________   INITIAL IMPRESSION / ASSESSMENT AND PLAN / ED COURSE  30 y.o. male with no significant past medical history who presents for evaluation of a rash.  Patient with diffuse hives but no signs of anaphylaxis.  Will provide a prescription for topical hydrocortisone and prednisone.  Recommended close follow-up with PCP and discussed my standard return precautions.      _____________________________________________ Please note:  Patient was evaluated in Emergency Department today for the symptoms described in the history of present illness. Patient was evaluated in the context of the global COVID-19 pandemic, which necessitated consideration that the patient might be at risk for infection with the SARS-CoV-2 virus that causes COVID-19. Institutional protocols and algorithms that pertain to the evaluation of patients at risk for COVID-19 are in a state of rapid change based on information released by regulatory bodies including the CDC and federal and state organizations. These policies and algorithms were followed during the patient's care  in the ED.  Some ED evaluations and interventions may be delayed as a result of limited staffing during the pandemic.   [place] Controlled Substance Database was reviewed by me. ____________________________________________   FINAL CLINICAL IMPRESSION(S) / ED DIAGNOSES   Final diagnoses:  Urticaria      NEW MEDICATIONS STARTED DURING THIS VISIT:  ED Discharge Orders          Ordered    predniSONE (DELTASONE) 20 MG tablet  Daily with breakfast        05/14/21 0245    hydrocortisone 1 % ointment  2 times daily        05/14/21 0245             Note:  This document was prepared using Dragon voice recognition software and may include unintentional dictation errors.    Don Perking, [place], MD 05/14/21 [phone]

## 2021-05-14 NOTE — ED Triage Notes (Addendum)
Pt was harvesting mango tree in [place] and sap made itchy red rash 8/4 that has progressed from L arm to general body. Denies fever, n/v/d. [place] provider gave shot of hydrocortisone and decadron and was placed on pranosine and celestamine that made worse.

## 2021-06-15 ENCOUNTER — Ambulatory Visit: Payer: Self-pay | Admitting: Urology

## 2021-09-06 ENCOUNTER — Ambulatory Visit: Payer: Self-pay

## 2021-09-06 ENCOUNTER — Other Ambulatory Visit: Payer: Self-pay

## 2021-09-06 DIAGNOSIS — Z0289 Encounter for other administrative examinations: Secondary | ICD-10-CM

## 2021-09-06 DIAGNOSIS — Z0000 Encounter for general adult medical examination without abnormal findings: Secondary | ICD-10-CM

## 2021-09-06 LAB — POCT URINALYSIS DIPSTICK
Appearance: NORMAL
Bilirubin, UA: NEGATIVE
Blood, UA: NEGATIVE
Glucose, UA: NEGATIVE
Ketones, UA: NEGATIVE
Leukocytes, UA: NEGATIVE
Nitrite, UA: NEGATIVE
Protein, UA: NEGATIVE
Spec Grav, UA: 1.025 (ref 1.010–1.025)
Urobilinogen, UA: 0.2 E.U./dL
pH, UA: 6 (ref 5.0–8.0)

## 2021-09-06 NOTE — Progress Notes (Signed)
UA final results normal. Executive panel drawn from [facility] and tolerated well, patient denied discomfort during procedure. BP assessed 148/94 RUE sitting at 8:57am. This will be re-checked after for Hearing / Eye screening.

## 2021-09-07 LAB — CMP12+LP+TP+TSH+6AC+CBC/D/PLT
ALT: 26 IU/L (ref 0–44)
AST: 27 IU/L (ref 0–40)
Albumin/Globulin Ratio: 1.9 (ref 1.2–2.2)
Albumin: 4.8 g/dL (ref 4.0–5.0)
Alkaline Phosphatase: 92 IU/L (ref 44–121)
BUN/Creatinine Ratio: 17 (ref 9–20)
BUN: 19 mg/dL (ref 6–20)
Basophils Absolute: 0 10*3/uL (ref 0.0–0.2)
Basos: 1 %
Bilirubin Total: 0.5 mg/dL (ref 0.0–1.2)
Calcium: 9.9 mg/dL (ref 8.7–10.2)
Chloride: 98 mmol/L (ref 96–106)
Chol/HDL Ratio: 4.1 ratio (ref 0.0–5.0)
Cholesterol, Total: 240 mg/dL — ABNORMAL HIGH (ref 100–199)
Creatinine, Ser: 1.13 mg/dL (ref 0.76–1.27)
EOS (ABSOLUTE): 0 10*3/uL (ref 0.0–0.4)
Eos: 0 %
Estimated CHD Risk: 0.8 times avg. (ref 0.0–1.0)
Free Thyroxine Index: 2.1 (ref 1.2–4.9)
GGT: 37 IU/L (ref 0–65)
Globulin, Total: 2.5 g/dL (ref 1.5–4.5)
Glucose: 99 mg/dL (ref 70–99)
HDL: 58 mg/dL (ref >39)
Hematocrit: 45.8 % (ref 37.5–51.0)
Hemoglobin: 15.7 g/dL (ref 13.0–17.7)
Immature Grans (Abs): 0 10*3/uL (ref 0.0–0.1)
Immature Granulocytes: 0 %
Iron: 107 ug/dL (ref 38–169)
LDH: 179 IU/L (ref 121–224)
LDL Chol Calc (NIH): 159 mg/dL — ABNORMAL HIGH (ref 0–99)
Lymphocytes Absolute: 2.4 10*3/uL (ref 0.7–3.1)
Lymphs: 42 %
MCH: 30.5 pg (ref 26.6–33.0)
MCHC: 34.3 g/dL (ref 31.5–35.7)
MCV: 89 fL (ref 79–97)
Monocytes Absolute: 0.4 10*3/uL (ref 0.1–0.9)
Monocytes: 7 %
Neutrophils Absolute: 2.9 10*3/uL (ref 1.4–7.0)
Neutrophils: 50 %
Phosphorus: 3.4 mg/dL (ref 2.8–4.1)
Platelets: 242 10*3/uL (ref 150–450)
Potassium: 4.3 mmol/L (ref 3.5–5.2)
RBC: 5.14 x10E6/uL (ref 4.14–5.80)
RDW: 12.1 % (ref 11.6–15.4)
Sodium: 138 mmol/L (ref 134–144)
T3 Uptake Ratio: 29 % (ref 24–39)
T4, Total: 7.3 ug/dL (ref 4.5–12.0)
TSH: 0.769 u[IU]/mL (ref 0.450–4.500)
Total Protein: 7.3 g/dL (ref 6.0–8.5)
Triglycerides: 127 mg/dL (ref 0–149)
Uric Acid: 5.7 mg/dL (ref 3.8–8.4)
VLDL Cholesterol Cal: 23 mg/dL (ref 5–40)
WBC: 5.7 10*3/uL (ref 3.4–10.8)
eGFR: 89 mL/min/{1.73_m2} (ref >59)

## 2021-09-12 ENCOUNTER — Ambulatory Visit: Payer: Self-pay | Admitting: Physician Assistant

## 2021-09-12 ENCOUNTER — Other Ambulatory Visit: Payer: Self-pay

## 2021-09-12 ENCOUNTER — Encounter: Payer: Self-pay | Admitting: Physician Assistant

## 2021-09-12 VITALS — BP 135/85 | HR 88 | Temp 98.2°F | Resp 14 | Ht 71.0 in | Wt 198.0 lb

## 2021-09-12 DIAGNOSIS — Z0289 Encounter for other administrative examinations: Secondary | ICD-10-CM

## 2021-09-12 MED ORDER — ROSUVASTATIN CALCIUM 20 MG PO TABS: 20.0000 mg | ORAL_TABLET | Freq: Every day | ORAL | 3 refills | Status: DC

## 2021-09-12 NOTE — Progress Notes (Signed)
   Subjective: Annual firefighter exam    Patient ID: Champ Keetch, male    DOB: 05/15/1991, 30 y.o.   MRN: [id number]  HPI Patient presents in follow-up for exam.  Voices no concerns or complaints.   Review of Systems No acute findings on review of system    Objective:   Physical Exam No acute distress.  Temperature 98.2, pulse 88, respiration 14, BP is 135/85, and patient is 99% O2 sat on room air.  Patient with 198 pounds and BMI is 27.6. HEENT is unremarkable.  Neck is supple without lymphadenopathy or bruits.  Lungs clear to auscultation.  Heart is regular rate and rhythm.  No acute findings on EKG. Abdomen is negative HSM, normoactive bowel sounds, soft, nontender to palpation. No obvious deformity to the upper or lower extremities.  Patient has full and equal range of motion upper and lower extremities. No obvious cervical or lumbar spine deformity.  Patient has full and equal range of motion cervical lumbar spine. Cranial nerves II through XII are grossly intact.  DTR 2+ without clonus.       Assessment & Plan: Well exam.   Discussed lab results with patient.  Patient cholesterol has decreased to 40.  Triglycerides decreased to 147.  Patient is amenable to trial statins.  46-month follow-up.

## 2021-09-12 NOTE — Progress Notes (Signed)
Pt denies any issues or concerns at this time/CL,RMA ?

## 2021-10-05 ENCOUNTER — Other Ambulatory Visit: Payer: Self-pay

## 2021-10-05 ENCOUNTER — Encounter: Payer: Self-pay | Admitting: Physician Assistant

## 2021-10-05 ENCOUNTER — Ambulatory Visit: Payer: 59 | Admitting: Physician Assistant

## 2021-10-05 DIAGNOSIS — J3489 Other specified disorders of nose and nasal sinuses: Secondary | ICD-10-CM

## 2021-10-05 DIAGNOSIS — J0100 Acute maxillary sinusitis, unspecified: Secondary | ICD-10-CM

## 2021-10-05 LAB — POC COVID19 BINAXNOW: SARS Coronavirus 2 Ag: NEGATIVE

## 2021-10-05 MED ORDER — NAPROXEN 500 MG PO TABS: 500.0000 mg | ORAL_TABLET | Freq: Two times a day (BID) | ORAL | 0 refills | Status: DC

## 2021-10-05 MED ORDER — AMOXICILLIN 875 MG PO TABS: 875.0000 mg | ORAL_TABLET | Freq: Two times a day (BID) | ORAL | 0 refills | Status: AC

## 2021-10-05 MED ORDER — FEXOFENADINE-PSEUDOEPHED ER 60-120 MG PO TB12: 1.0000 | ORAL_TABLET | Freq: Two times a day (BID) | ORAL | 0 refills | Status: DC

## 2021-10-05 NOTE — Progress Notes (Signed)
S/Sx x2-3 weeks: Sinus pressure around eyes, watery eyes & sneezing   Denies HA, ear discomfort, teeth discomfort, cough, sore throat or fever.   Hasn't taken any anti-histamines   States I have to come up there every year this time to get medication for a sinus infection.  Rapid Covid Test = NEGATIVE   AMD

## 2021-10-05 NOTE — Progress Notes (Signed)
° °  Subjective: Sinus congestion    Patient ID: Mitchell Burke, male    DOB: 19-May-1991, 31 y.o.   MRN: [id number]  HPI Patient complains of 3 weeks of sinus congestion with facial pain.  Patient states this is a seasonal problem.  Patient tested negative for COVID-19 and influenza today.  Patient denies recent travel or known contact with COVID-19.  Denies fever, cough, sore throat.  Review of Systems Negative septal complaint.    Objective:   Physical Exam This is a virtual visit.       Assessment & Plan: Subacute maxillary sinusitis.   Patient given a prescription for amoxicillin, Allegra-D, and naproxen.  Advised to follow-up if no improvement or worsening complaint in 5 days.

## 2021-10-05 NOTE — Progress Notes (Signed)
S/Sx x2-3 weeks: Sinus pressure around eyes, watery eyes & sneezing  Denies HA, ear discomfort, teeth discomfort, cough, sore throat or fever.  Hasn't taken any anti-histamines  States I have to come up there every year this time to get medication for a sinus infection.  AMD

## 2022-03-16 ENCOUNTER — Other Ambulatory Visit: Payer: Self-pay

## 2022-03-16 DIAGNOSIS — E7849 Other hyperlipidemia: Secondary | ICD-10-CM

## 2022-07-12 DIAGNOSIS — H5213 Myopia, bilateral: Secondary | ICD-10-CM | POA: Diagnosis not present

## 2022-08-08 ENCOUNTER — Ambulatory Visit: Payer: Self-pay

## 2022-08-08 ENCOUNTER — Other Ambulatory Visit: Payer: Self-pay | Admitting: Physician Assistant

## 2022-08-08 ENCOUNTER — Other Ambulatory Visit: Payer: Self-pay

## 2022-08-08 DIAGNOSIS — Z0000 Encounter for general adult medical examination without abnormal findings: Secondary | ICD-10-CM

## 2022-08-08 DIAGNOSIS — L290 Pruritus ani: Secondary | ICD-10-CM

## 2022-08-08 LAB — POCT URINALYSIS DIPSTICK
Bilirubin, UA: NEGATIVE
Blood, UA: NEGATIVE
Glucose, UA: NEGATIVE
Ketones, UA: NEGATIVE
Leukocytes, UA: NEGATIVE
Nitrite, UA: NEGATIVE
Protein, UA: NEGATIVE
Spec Grav, UA: 1.01 (ref 1.010–1.025)
Urobilinogen, UA: 0.2 E.U./dL
pH, UA: 6.5 (ref 5.0–8.0)

## 2022-08-08 MED ORDER — HYDROCORTISONE 1 % EX OINT: 1.0000 | TOPICAL_OINTMENT | Freq: Two times a day (BID) | CUTANEOUS | 0 refills | Status: AC

## 2022-08-08 NOTE — Progress Notes (Signed)
Pt presents today for physical labs, will return to clinic for scheduled physical./CL,RMA 

## 2022-08-09 LAB — CMP12+LP+TP+TSH+6AC+CBC/D/PLT
ALT: 21 IU/L (ref 0–44)
AST: 15 IU/L (ref 0–40)
Albumin/Globulin Ratio: 1.9 (ref 1.2–2.2)
Albumin: 4.9 g/dL (ref 4.1–5.1)
Alkaline Phosphatase: 83 IU/L (ref 44–121)
BUN/Creatinine Ratio: 13 (ref 9–20)
BUN: 16 mg/dL (ref 6–20)
Basophils Absolute: 0 10*3/uL (ref 0.0–0.2)
Basos: 1 %
Bilirubin Total: 0.5 mg/dL (ref 0.0–1.2)
Calcium: 9.6 mg/dL (ref 8.7–10.2)
Chloride: 99 mmol/L (ref 96–106)
Chol/HDL Ratio: 4.2 ratio (ref 0.0–5.0)
Cholesterol, Total: 247 mg/dL — ABNORMAL HIGH (ref 100–199)
Creatinine, Ser: 1.23 mg/dL (ref 0.76–1.27)
EOS (ABSOLUTE): 0.4 10*3/uL (ref 0.0–0.4)
Eos: 7 %
Estimated CHD Risk: 0.8 times avg. (ref 0.0–1.0)
Free Thyroxine Index: 2.5 (ref 1.2–4.9)
GGT: 32 IU/L (ref 0–65)
Globulin, Total: 2.6 g/dL (ref 1.5–4.5)
Glucose: 93 mg/dL (ref 70–99)
HDL: 59 mg/dL (ref >39)
Hematocrit: 46.5 % (ref 37.5–51.0)
Hemoglobin: 15.5 g/dL (ref 13.0–17.7)
Immature Grans (Abs): 0 10*3/uL (ref 0.0–0.1)
Immature Granulocytes: 0 %
Iron: 176 ug/dL — ABNORMAL HIGH (ref 38–169)
LDH: 189 IU/L (ref 121–224)
LDL Chol Calc (NIH): 154 mg/dL — ABNORMAL HIGH (ref 0–99)
Lymphocytes Absolute: 2.1 10*3/uL (ref 0.7–3.1)
Lymphs: 35 %
MCH: 30.5 pg (ref 26.6–33.0)
MCHC: 33.3 g/dL (ref 31.5–35.7)
MCV: 92 fL (ref 79–97)
Monocytes Absolute: 0.3 10*3/uL (ref 0.1–0.9)
Monocytes: 5 %
Neutrophils Absolute: 3.1 10*3/uL (ref 1.4–7.0)
Neutrophils: 52 %
Phosphorus: 3.5 mg/dL (ref 2.8–4.1)
Platelets: 241 10*3/uL (ref 150–450)
Potassium: 4.1 mmol/L (ref 3.5–5.2)
RBC: 5.08 x10E6/uL (ref 4.14–5.80)
RDW: 12.4 % (ref 11.6–15.4)
Sodium: 137 mmol/L (ref 134–144)
T3 Uptake Ratio: 30 % (ref 24–39)
T4, Total: 8.2 ug/dL (ref 4.5–12.0)
TSH: 0.918 u[IU]/mL (ref 0.450–4.500)
Total Protein: 7.5 g/dL (ref 6.0–8.5)
Triglycerides: 191 mg/dL — ABNORMAL HIGH (ref 0–149)
Uric Acid: 5.6 mg/dL (ref 3.8–8.4)
VLDL Cholesterol Cal: 34 mg/dL (ref 5–40)
WBC: 5.9 10*3/uL (ref 3.4–10.8)
eGFR: 80 mL/min/{1.73_m2} (ref >59)

## 2022-08-15 ENCOUNTER — Ambulatory Visit: Payer: Self-pay | Admitting: Physician Assistant

## 2022-08-15 ENCOUNTER — Encounter: Payer: Self-pay | Admitting: Physician Assistant

## 2022-08-15 VITALS — BP 131/78 | HR 85 | Temp 98.0°F | Resp 14 | Ht 74.0 in | Wt 200.0 lb

## 2022-08-15 DIAGNOSIS — Z0000 Encounter for general adult medical examination without abnormal findings: Secondary | ICD-10-CM

## 2022-08-15 NOTE — Progress Notes (Signed)
[facility]   None    (approximate)  I have reviewed the triage vital signs and the nursing notes.   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  Patient voices no concerns or complaints.  When questioned about his increased cholesterol and triglycerides patient admits to noncompliance of medication which was written last year.         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 No pertinent surgical history.  Prior to Admission medications   Medication Sig Start Date End Date Taking? Authorizing Provider  hydrocortisone 1 % ointment Apply 1 Application topically 2 (two) times daily. 08/08/22  Yes Sable Feil, PA-C  rosuvastatin (CRESTOR) 20 MG tablet Take 1 tablet (20 mg total) by mouth daily. 09/12/21  Yes Sable Feil, PA-C    Allergies Patient has no known allergies.  History reviewed. No pertinent family history.  Social History Social History   Tobacco Use   Smoking status: Some Days    Types: Cigarettes   Smokeless tobacco: Never  Substance Use Topics   Alcohol use: No   Drug use: No    Comment: uses Suboxone    Review of Systems Constitutional: No fever/chills Eyes: No visual changes. ENT: No sore throat. Cardiovascular: Denies chest pain. Respiratory: Denies shortness of breath. Gastrointestinal: No abdominal pain.  No nausea, no vomiting.  No diarrhea.  No constipation. Genitourinary: Negative for dysuria. Musculoskeletal: Negative  for back pain. Skin: Negative for rash. Neurological: Negative for headaches, focal weakness or numbness. Psychiatric: Anxiety and insomnia Endocrine: Hyperlipidemia   ____________________________________________   PHYSICAL EXAM:  VITAL SIGNS: BP is 131/78, pulse 85, respiration 14, temperature 98, patient 99% O2 sat on room air.  Patient weighs 200 pounds and BMI is 25.68. Constitutional: Alert and oriented. Well appearing and in no acute distress. Eyes: Conjunctivae are normal. PERRL. EOMI. Head: Atraumatic. Nose: No congestion/rhinnorhea. Mouth/Throat: Mucous membranes are moist.  Oropharynx non-erythematous. Neck: No stridor.  No cervical spine tenderness to palpation. Hematological/Lymphatic/Immunilogical: No cervical lymphadenopathy. Cardiovascular: Normal rate, regular rhythm. Grossly normal heart sounds.  Good peripheral circulation. Respiratory: Normal respiratory effort.  No retractions. Lungs CTAB. Gastrointestinal: Soft and nontender. No distention. No abdominal bruits. No CVA tenderness. Genitourinary: Deferred Musculoskeletal: No lower extremity tenderness nor edema.  No joint effusions. Neurologic:  Normal speech and language. No gross focal neurologic deficits are appreciated. No gait instability. Skin:  Skin is warm, dry and intact. No rash noted. Psychiatric: Mood and affect are normal. Speech and behavior are normal.  ____________________________________________   LABS _ 0 Result Notes           Component Ref Range & Units 7 d ago (08/08/22) 11 mo ago (09/06/21) 1 yr ago (12/09/20) 1 yr ago (11/25/20) 1 yr ago (09/06/20) 2 yr ago (08/09/20) 2 yr ago (09/22/19)  Color, UA  yellow Yellow Yellow R yellow  yellow Yellow  Clarity, UA  clear Clear  clear  cloudy Clear  Glucose, UA  Negative Negative Negative  Negative  Negative Negative  Bilirubin, UA  neg Negative Negative R negative  negative negative  Ketones, UA  neg Negative Negative R negative  +- Negative   Spec Grav, UA 1.010 - 1.025 1.010 1.025 1.025 R 1.015  1.015 1.025  Blood, UA  neg Negative Trace Abnormal  R negative  negative Negative  pH, UA 5.0 - 8.0 6.5 6.0 6.0 R 6.0  7.0 6.0  Protein, UA Negative Negative Negative Negative R Negative  Positive Abnormal  CM Negative  Urobilinogen, UA 0.2 or 1.0 E.U./dL 0.2 0.2  0.2  0.2 0.2  Nitrite, UA  neg Negative Negative R negative  negative Negative  Leukocytes, UA _0   Negative Negative  Appearance   Normal  light CLEAR Abnormal  R dark   Odor   None       Resulting Agency    [facility]                     Component Ref Range & Units 7 d ago (08/08/22) 11 mo ago (09/06/21) 1 yr ago (11/25/20) 1 yr ago (09/06/20) 1 yr ago (09/06/20) 2 yr ago (08/09/20) 2 yr ago (06/10/20) 2 yr ago (06/10/20) 2 yr ago (06/10/20)  Glucose 70 - 99 mg/dL 93 99 93 R 113 High  CM  104 High  R   84 R  Uric Acid 3.8 - 8.4 mg/dL 5.6 5.7 CM 7.2 CM   6.4 CM     Comment:            Therapeutic target for gout patients: <6.0  BUN 6 - 20 mg/dL _1 Creatinine, Ser 0.76 - 1.27 mg/dL 1.23 1.13 1.13 CM 1.09 R  1.04   1.16  eGFR >59 mL/min/1.73 80 89         BUN/Creatinine Ratio 9 - _2 Sodium 134 - 144 mmol/L 137 138 142 138 R  138   138  Potassium 3.5 - 5.2 mmol/L 4.1 4.3 4.2 3.5 R  3.9   4.0  Chloride 96 - 106 mmol/L 99 98 103 102 R  100   99  Calcium 8.7 - 10.2 mg/dL 9.6 9.9 9.6 9.5 R  9.6   9.5  Phosphorus 2.8 - 4.1 mg/dL 3.5 3.4 3.1   2.8     Total Protein 6.0 - 8.5 g/dL 7.5 7.3 7.1 7.9 R  7.4   7.9  Albumin 4.1 - 5.1 g/dL 4.9 4.8 R 4.4 R 4.7 R  4.6 R   4.9 R  Globulin, Total 1.5 - 4.5 g/dL 2.6 2.5 2.7   2.8   3.0  Albumin/Globulin Ratio 1.2 - 2.2 1.9 1.9 1.6   1.6   1.6  Bilirubin Total 0.0 - 1.2 mg/dL 0.5 0.5 0.7 0.6 R  0.6   0.3  Alkaline Phosphatase 44 - 121 IU/L 83 92 86 62 R  68 CM   83 R, CM  LDH 121 - 224 IU/L 189 179 189   185     AST 0 - 40 IU/L _3 R  19   30   ALT 0 - 44 IU/L _4 38 R  41   35  GGT 0 - 65 IU/L 32 37 28   40     Iron 38 - 169 ug/dL 176 High  107 172 High    183 High      Cholesterol, Total 100 - 199 mg/dL 247 High  240 High  219 High    291 High      Triglycerides 0 - 149 mg/dL 191 High  127 176 High    294 High      HDL >39 mg/dL 59 58 54   54     VLDL Cholesterol Cal 5 - 40 mg/dL 34 23 31   57 High      LDL Chol Calc (NIH) 0 - 99 mg/dL 154 High  159 High  134 High    180 High      Chol/HDL Ratio 0.0 - 5.0 ratio 4.2 4.1 CM 4.1 CM   5.4 High  CM     Comment:                                   T. Chol/HDL Ratio                                             Men  Women                               1/2 Avg.Risk  3.4    3.3                                   Avg.Risk  5.0    4.4                                2X Avg.Risk  9.6    7.1                                3X Avg.Risk 23.4   11.0  Estimated CHD Risk 0.0 - 1.0 times avg. 0.8 0.8 CM 0.8 CM   1.1 High  CM     Comment: The CHD Risk is based on the T. Chol/HDL ratio. Other factors affect CHD Risk such as hypertension, smoking, diabetes, severe obesity, and family history of premature CHD.  TSH 0.450 - 4.500 uIU/mL 0.918 0.769 0.821   0.693  0.478   T4, Total 4.5 - 12.0 ug/dL 8.2 7.3 7.1   6.9     T3 Uptake Ratio 24 - 39 % _0 Free Thyroxine Index 1.2 - 4.9 2.5 2.1 2.0   1.9     WBC 3.4 - 10.8 x10E3/uL 5.9 5.7 6.1  6.5 R 5.7 6.4    RBC 4.14 - 5.80 x10E6/uL 5.08 5.14 5.39  5.10 R 5.19 5.06    Hemoglobin 13.0 - 17.7 g/dL 15.5 15.7 16.4  16.0 R 16.0 16.1    Hematocrit 37.5 - 51.0 % 46.5 45.8 49.3  46.6 R 46.5 45.7    MCV 79 - 97 fL 92 89 92  91.4 R 90 90    MCH 26.6 - 33.0 pg 30.5 30.5 30.4  31.4 R 30.8 31.8    MCHC 31.5 - 35.7 g/dL 33.3 34.3 33.3  34.3 R 34.4 35.2    RDW 11.6 - 15.4 % 12.4 12.1 12.4  13.1 R 13.0 12.8    Platelets 150 - 450 x10E3/uL 241 242 247  230 R 218 241    Neutrophils Not Estab. % 52 50 54   66 58    Lymphs Not Estab. % 35 42 39   27 34     Monocytes Not Estab. % _0 Eos Not Estab. % 7 0 0   1 0    Basos Not Estab. % _1 0 1    Neutrophils Absolute 1.4 - 7.0 x10E3/uL 3.1 2.9 3.3   3.8 3.7    Lymphocytes Absolute 0.7 - 3.1 x10E3/uL 2.1 2.4 2.4   1.5 2.2    Monocytes Absolute 0.1 - 0.9 x10E3/uL 0.3 0.4 0.4   0.3 0.4    EOS (ABSOLUTE) 0.0 - 0.4 x10E3/uL 0.4 0.0 0.0   0.0 0.0    Basophils Absolute 0.0 - 0.2 x10E3/uL 0.0 0.0 0.0   0.0 0.0    Immature Granulocytes Not Estab. % 0 0 0   0 0                    ___________________________________________  EKG  Sinus rhythm at 66 bpm ____________________________________________    ____________________________________________   INITIAL IMPRESSION / ASSESSMENT AND PLAN As part of my medical decision making, I reviewed the following data within the [facility]      Discussed lab results with patient showing increase of his triglycerides and cholesterol.  No acute findings on EKG.  Patient is amenable to restarting Crestor at 20 mg daily and follow-up fasting lipid lab in 3 months.        ____________________________________________   FINAL CLINICAL IMPRESSION Well exam   ED Discharge Orders     None        Note:  This document was prepared using Dragon voice recognition software and may include unintentional dictation errors.

## 2022-08-15 NOTE — Progress Notes (Signed)
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 

## 2022-08-22 LAB — TESTOSTERONE,FREE AND TOTAL
Testosterone, Free: 10.1 pg/mL (ref 8.7–25.1)
Testosterone: 437 ng/dL (ref 264–916)

## 2022-08-22 LAB — SPECIMEN STATUS REPORT

## 2022-09-23 ENCOUNTER — Other Ambulatory Visit: Payer: Self-pay | Admitting: Physician Assistant

## 2022-09-28 ENCOUNTER — Other Ambulatory Visit: Payer: Self-pay

## 2022-09-28 DIAGNOSIS — E785 Hyperlipidemia, unspecified: Secondary | ICD-10-CM

## 2022-09-29 MED ORDER — ROSUVASTATIN CALCIUM 20 MG PO TABS: 20.0000 mg | ORAL_TABLET | Freq: Every day | ORAL | 3 refills | Status: DC

## 2022-11-14 ENCOUNTER — Other Ambulatory Visit: Payer: Self-pay

## 2022-11-14 DIAGNOSIS — E782 Mixed hyperlipidemia: Secondary | ICD-10-CM

## 2022-11-14 NOTE — Progress Notes (Signed)
Repeat lipid panel completed as ordered and tolerated well.

## 2022-11-15 LAB — LIPID PANEL
Chol/HDL Ratio: 3.1 ratio (ref 0.0–5.0)
Cholesterol, Total: 165 mg/dL (ref 100–199)
HDL: 53 mg/dL (ref >39)
LDL Chol Calc (NIH): 87 mg/dL (ref 0–99)
Triglycerides: 145 mg/dL (ref 0–149)
VLDL Cholesterol Cal: 25 mg/dL (ref 5–40)

## 2022-11-30 ENCOUNTER — Ambulatory Visit
Admission: EM | Admit: 2022-11-30 | Discharge: 2022-11-30 | Disposition: A | Discharge status: Home / Self Care | Payer: 59 | Attending: Emergency Medicine | Admitting: Emergency Medicine

## 2022-11-30 DIAGNOSIS — J020 Streptococcal pharyngitis: Secondary | ICD-10-CM

## 2022-11-30 LAB — POCT RAPID STREP A (OFFICE): Rapid Strep A Screen: POSITIVE — AB

## 2022-11-30 MED ORDER — PENICILLIN G BENZATHINE 1200000 UNIT/2ML IM SUSY
1.2000 10*6.[IU] | PREFILLED_SYRINGE | Freq: Once | INTRAMUSCULAR | Status: AC
  Administered 2022-11-30: 1.2 10*6.[IU] via INTRAMUSCULAR

## 2022-11-30 NOTE — ED Triage Notes (Signed)
Patient to Urgent Care with complaints of sore throat and left sided ear pain. Symptoms started Sunday morning. Sinus pain/ pressure.   Denies any known fevers.   Has been taking ibuprofen/ mucinex cold and flu.

## 2022-11-30 NOTE — Discharge Instructions (Addendum)
You were given an injection of a long-acting penicillin today to treat your strep throat.  No additional antibiotic is needed.    Take Tylenol or ibuprofen as needed for fever or discomfort.     Follow-up with your primary care provider if your symptoms are not improving.

## 2022-11-30 NOTE — ED Provider Notes (Signed)
[facility]    CSN: [id number] Arrival date & time: 11/30/22  1210      History   Chief Complaint Chief Complaint  Patient presents with   Sore Throat   Otalgia    HPI Mitchell Burke is a 32 y.o. male.  Patient presents with a day history of sore throat, left ear pain, sinus pressure.  Treating symptoms with ibuprofen and OTC cold medication.  He denies fever, chills, rash, cough, shortness of breath, vomiting, diarrhea, or other symptoms.     The history is provided by the patient and medical records.    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 No pertinent surgical history.     Home Medications    Prior to Admission medications   Medication Sig Start Date End Date Taking? Authorizing Provider  hydrocortisone 1 % ointment Apply 1 Application topically 2 (two) times daily. 08/08/22   Sable Feil, PA-C  rosuvastatin (CRESTOR) 20 MG tablet Take 1 tablet (20 mg total) by mouth daily. 09/29/22   Sable Feil, PA-C    Family History History reviewed. No pertinent family history.  Social History Social History   Tobacco Use   Smoking status: Some Days    Types: Cigarettes   Smokeless tobacco: Never  Substance Use Topics   Alcohol use: Not Currently    Comment: socially   Drug use: No    Comment: uses Suboxone     Allergies   Patient has no known allergies.   Review of Systems Review of Systems  Constitutional:  Negative for chills and fever.  HENT:  Positive for ear pain, sinus pressure and sore throat.   Respiratory:  Negative for cough and shortness  of breath.   Cardiovascular:  Negative for chest pain and palpitations.  Gastrointestinal:  Negative for diarrhea and vomiting.  Skin:  Negative for rash.  All other systems reviewed and are negative.    Physical Exam Triage Vital Signs ED Triage Vitals  Enc Vitals Group     BP      Pulse      Resp      Temp      Temp src      SpO2      Weight      Height      Head Circumference      Peak Flow      Pain Score      Pain Loc      Pain Edu?      Excl. in [place]?    No data found.  Updated Vital Signs BP 138/87   Pulse 72   Temp 97.8 F (36.6 C)   Resp 18   SpO2 98%   Visual Acuity Right Eye Distance:   Left Eye Distance:   Bilateral Distance:    Right Eye Near:   Left Eye Near:    Bilateral Near:     Physical Exam Vitals and nursing note reviewed.  Constitutional:      General: He is not in acute distress.    Appearance: Normal  appearance. He is well-developed. He is not ill-appearing.  HENT:     Right Ear: Tympanic membrane normal.     Left Ear: Tympanic membrane normal.     Nose: Nose normal.     Mouth/Throat:     Mouth: Mucous membranes are moist.     Pharynx: Posterior oropharyngeal erythema present.  Cardiovascular:     Rate and Rhythm: Normal rate and regular rhythm.     Heart sounds: Normal heart sounds.  Pulmonary:     Effort: Pulmonary effort is normal. No respiratory distress.     Breath sounds: Normal breath sounds.  Musculoskeletal:     Cervical back: Neck supple.  Skin:    General: Skin is warm and dry.  Neurological:     Mental Status: He is alert.  Psychiatric:        Mood and Affect: Mood normal.        Behavior: Behavior normal.      UC Treatments / Results  Labs (all labs ordered are listed, but only abnormal results are displayed) Labs Reviewed  POCT RAPID STREP A (OFFICE) - Abnormal; Notable for the following components:      Result Value   Rapid Strep A Screen Positive (*)    All other components within normal limits     EKG   Radiology No results found.  Procedures Procedures (including critical care time)  Medications Ordered in UC Medications  penicillin g benzathine (BICILLIN LA) 1200000 UNIT/2ML injection 1.2 Million Units (1.2 Million Units Intramuscular Given 11/30/22 1239)    Initial Impression / Assessment and Plan / UC Course  I have reviewed the triage vital signs and the nursing notes.  Pertinent labs & imaging results that were available during my care of the patient were reviewed by me and considered in my medical decision making (see chart for details).    Strep pharyngitis.  Rapid strep positive.  Per patient preference, treating with Bicillin LA.  Education provided on strep throat.  Discussed Tylenol or ibuprofen as needed.  Instructed patient to follow up with his PCP if his symptoms are not improving.  He agrees to plan of care.    Final Clinical Impressions(s) / UC Diagnoses   Final diagnoses:  Strep pharyngitis     Discharge Instructions      You were given an injection of a long-acting penicillin today to treat your strep throat.  No additional antibiotic is needed.    Take Tylenol or ibuprofen as needed for fever or discomfort.     Follow-up with your primary care provider if your symptoms are not improving.         ED Prescriptions   None    PDMP not reviewed this encounter.   Sharion Balloon, NP 11/30/22 1243

## 2023-01-02 ENCOUNTER — Ambulatory Visit
Admission: EM | Admit: 2023-01-02 | Discharge: 2023-01-02 | Disposition: A | Discharge status: Home / Self Care | Payer: 59 | Attending: Urgent Care | Admitting: Urgent Care

## 2023-01-02 DIAGNOSIS — J029 Acute pharyngitis, unspecified: Secondary | ICD-10-CM | POA: Diagnosis not present

## 2023-01-02 LAB — POCT RAPID STREP A (OFFICE): Rapid Strep A Screen: NEGATIVE

## 2023-01-02 NOTE — ED Provider Notes (Addendum)
Mitchell Burke    CSN: [id number] Arrival date & time: 01/02/23  1020      History   Chief Complaint Chief Complaint  Patient presents with   Sore Throat    HPI [name] is a 32 y.o. male.    Sore Throat    Presents urgent care with complaint of sore throat starting yesterday.  Denies known fever.  Endorses family history of recent strep infection.  Using over-the-counter medication for seasonal allergies.  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 No pertinent surgical history.     Home Medications    Prior to Admission medications   Medication Sig Start Date End Date Taking? Authorizing Provider  hydrocortisone 1 % ointment Apply 1 Application topically 2 (two) times daily. 08/08/22   Sable Feil, PA-C  rosuvastatin (CRESTOR) 20 MG tablet Take 1 tablet (20 mg total) by mouth daily. 09/29/22   Sable Feil, PA-C    Family History History reviewed. No pertinent family history.  Social History Social History   Tobacco Use   Smoking status: Some Days    Types: Cigarettes   Smokeless tobacco: Never  Substance Use Topics   Alcohol use: Not Currently    Comment: socially   Drug use: No    Comment: uses Suboxone     Allergies   Patient has no known allergies.   Review of Systems Review of Systems   Physical Exam Triage Vital Signs ED Triage Vitals  Enc Vitals Group     BP 01/02/23 1055 (!) 141/77     Pulse Rate 01/02/23 1055 67     Resp 01/02/23 1055 18     Temp 01/02/23 1055 98.4 F (36.9 C)     Temp src --      SpO2 01/02/23 1055 98 %      Weight 01/02/23 1102 200 lb (90.7 kg)     Height 01/02/23 1102 6\' 2"  (1.88 m)     Head Circumference --      Peak Flow --      Pain Score 01/02/23 1059 4     Pain Loc --      Pain Edu? --      Excl. in [place]? --    No data found.  Updated Vital Signs BP (!) 141/77   Pulse 67   Temp 98.4 F (36.9 C)   Resp 18   Ht 6\' 2"  (1.88 m)   Wt 200 lb (90.7 kg)   SpO2 98%   BMI 25.68 kg/m   Visual Acuity Right Eye Distance:   Left Eye Distance:   Bilateral Distance:    Right Eye Near:   Left Eye Near:    Bilateral Near:     Physical Exam Vitals reviewed.  Constitutional:      Appearance: He is well-developed.  HENT:     Mouth/Throat:     Mouth: Mucous membranes are moist.     Pharynx: No oropharyngeal exudate or posterior oropharyngeal erythema.  Skin:    General: Skin is warm and dry.  Neurological:  General: No focal deficit present.     Mental Status: He is alert and oriented to person, place, and time.      UC Treatments / Results  Labs (all labs ordered are listed, but only abnormal results are displayed) Labs Reviewed  POCT RAPID STREP A (OFFICE)    EKG   Radiology No results found.  Procedures Procedures (including critical care time)  Medications Ordered in UC Medications - No data to display  Initial Impression / Assessment and Plan / UC Course  I have reviewed the triage vital signs and the nursing notes.  Pertinent labs & imaging results that were available during my care of the patient were reviewed by me and considered in my medical decision making (see chart for details).   Patient is afebrile here without recent antipyretics. Satting well on room air. Overall is well appearing, well hydrated, without respiratory distress.  No pharyngeal erythema or peritonsillar exudates.  Rapid strep is negative.  Patient's symptoms are consistent with viral versus allergic pharyngitis.  Recommended use of OTC medication for supportive care.  Patient  declines offer of confirmatory strep culture.  Acknowledges treatment plan as well as agreement.  Final Clinical Impressions(s) / UC Diagnoses   Final diagnoses:  None   Discharge Instructions   None    ED Prescriptions   None    PDMP not reviewed this encounter.   Rose Burke, Mitchell 01/02/23 [place], Mitchell 01/02/23 1143

## 2023-01-02 NOTE — ED Triage Notes (Signed)
Patient to Urgent Care with complaints of sore throat that started yesterday. Denies any known fevers.   States that his wife and son have both had strep in the last week.

## 2023-01-02 NOTE — Discharge Instructions (Signed)
Follow up here or with your primary care provider if your symptoms are worsening or not improving with treatment.     

## 2023-04-28 ENCOUNTER — Other Ambulatory Visit: Payer: Self-pay

## 2023-04-28 ENCOUNTER — Emergency Department: Payer: 59

## 2023-04-28 ENCOUNTER — Emergency Department
Admission: EM | Admit: 2023-04-28 | Discharge: 2023-04-28 | Disposition: A | Discharge status: Home / Self Care | Payer: 59 | Attending: Emergency Medicine | Admitting: Emergency Medicine

## 2023-04-28 DIAGNOSIS — S39012A Strain of muscle, fascia and tendon of lower back, initial encounter: Secondary | ICD-10-CM

## 2023-04-28 DIAGNOSIS — M5431 Sciatica, right side: Secondary | ICD-10-CM

## 2023-04-28 DIAGNOSIS — M5441 Lumbago with sciatica, right side: Secondary | ICD-10-CM | POA: Diagnosis not present

## 2023-04-28 DIAGNOSIS — M4317 Spondylolisthesis, lumbosacral region: Secondary | ICD-10-CM

## 2023-04-28 DIAGNOSIS — M4316 Spondylolisthesis, lumbar region: Secondary | ICD-10-CM | POA: Insufficient documentation

## 2023-04-28 DIAGNOSIS — X500XXA Overexertion from strenuous movement or load, initial encounter: Secondary | ICD-10-CM | POA: Insufficient documentation

## 2023-04-28 DIAGNOSIS — R109 Unspecified abdominal pain: Secondary | ICD-10-CM | POA: Diagnosis not present

## 2023-04-28 DIAGNOSIS — M545 Low back pain, unspecified: Secondary | ICD-10-CM | POA: Diagnosis not present

## 2023-04-28 LAB — URINALYSIS, ROUTINE W REFLEX MICROSCOPIC
Bacteria, UA: NONE SEEN
Bilirubin Urine: NEGATIVE
Glucose, UA: NEGATIVE mg/dL
Hgb urine dipstick: NEGATIVE
Ketones, ur: NEGATIVE mg/dL
Leukocytes,Ua: NEGATIVE
Nitrite: NEGATIVE
Protein, ur: 30 mg/dL — AB
Specific Gravity, Urine: 1.019 (ref 1.005–1.030)
pH: 5 (ref 5.0–8.0)

## 2023-04-28 MED ORDER — CYCLOBENZAPRINE HCL 5 MG PO TABS: 5.0000 mg | ORAL_TABLET | Freq: Three times a day (TID) | ORAL | 0 refills | Status: DC | PRN

## 2023-04-28 MED ORDER — PREDNISONE 20 MG PO TABS: 40.0000 mg | ORAL_TABLET | Freq: Every day | ORAL | 0 refills | Status: AC

## 2023-04-28 MED ORDER — DICLOFENAC SODIUM 75 MG PO TBEC: 75.0000 mg | DELAYED_RELEASE_TABLET | Freq: Two times a day (BID) | ORAL | 1 refills | Status: AC

## 2023-04-28 MED ORDER — KETOROLAC TROMETHAMINE 30 MG/ML IJ SOLN
30.0000 mg | Freq: Once | INTRAMUSCULAR | Status: AC
  Administered 2023-04-28: 30 mg via INTRAMUSCULAR
  Filled 2023-04-28: qty 1

## 2023-04-28 MED ORDER — HYDROCODONE-ACETAMINOPHEN 5-325 MG PO TABS: 1.0000 | ORAL_TABLET | Freq: Three times a day (TID) | ORAL | 0 refills | Status: AC | PRN

## 2023-04-28 MED ORDER — CYCLOBENZAPRINE HCL 10 MG PO TABS
10.0000 mg | ORAL_TABLET | Freq: Once | ORAL | Status: AC
  Administered 2023-04-28: 10 mg via ORAL
  Filled 2023-04-28: qty 1
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
## 2023-04-28 NOTE — Discharge Instructions (Addendum)
Your exam, labs, XR, and CT scan are all normal and overall reassuring.  Take the prescription meds as directed.  Follow-up with [facility] orthopedics for ongoing evaluation and concerns.

## 2023-07-01 ENCOUNTER — Emergency Department: Payer: 59

## 2023-07-01 ENCOUNTER — Emergency Department
Admission: EM | Admit: 2023-07-01 | Discharge: 2023-07-01 | Disposition: A | Discharge status: Home / Self Care | Payer: 59 | Attending: Emergency Medicine | Admitting: Emergency Medicine

## 2023-07-01 ENCOUNTER — Other Ambulatory Visit: Payer: Self-pay

## 2023-07-01 DIAGNOSIS — R002 Palpitations: Secondary | ICD-10-CM | POA: Diagnosis not present

## 2023-07-01 DIAGNOSIS — R079 Chest pain, unspecified: Secondary | ICD-10-CM | POA: Diagnosis not present

## 2023-07-01 DIAGNOSIS — R519 Headache, unspecified: Secondary | ICD-10-CM | POA: Diagnosis not present

## 2023-07-01 LAB — CBC
HCT: 46 % (ref 39.0–52.0)
Hemoglobin: 16 g/dL (ref 13.0–17.0)
MCH: 31.6 pg (ref 26.0–34.0)
MCHC: 34.8 g/dL (ref 30.0–36.0)
MCV: 90.9 fL (ref 80.0–100.0)
Platelets: 259 10*3/uL (ref 150–400)
RBC: 5.06 MIL/uL (ref 4.22–5.81)
RDW: 12.5 % (ref 11.5–15.5)
WBC: 6.2 10*3/uL (ref 4.0–10.5)
nRBC: 0 % (ref 0.0–0.2)

## 2023-07-01 LAB — BASIC METABOLIC PANEL
Anion gap: 10 (ref 5–15)
BUN: 17 mg/dL (ref 6–20)
CO2: 25 mmol/L (ref 22–32)
Calcium: 9.2 mg/dL (ref 8.9–10.3)
Chloride: 103 mmol/L (ref 98–111)
Creatinine, Ser: 1.09 mg/dL (ref 0.61–1.24)
GFR, Estimated: >60 mL/min (ref >60)
Glucose, Bld: 100 mg/dL — ABNORMAL HIGH (ref 70–99)
Potassium: 3.7 mmol/L (ref 3.5–5.1)
Sodium: 138 mmol/L (ref 135–145)

## 2023-07-01 LAB — TROPONIN I (HIGH SENSITIVITY)
Troponin I (High Sensitivity): <2 ng/L (ref <18)
Troponin I (High Sensitivity): <2 ng/L (ref <18)

## 2023-07-01 LAB — TSH: TSH: 0.793 u[IU]/mL (ref 0.350–4.500)

## 2023-07-01 NOTE — ED Provider Notes (Signed)
[facility] Provider Note    Event Date/Time   First MD Initiated Contact with Patient 07/01/23 1534     (approximate)   History   Chest Pain   HPI  Mitchell Burke is a 32 y.o. male with a past medical history of sleep apnea, GERD, anxiety who presents today for evaluation of feeling like his heart is pumping very hard.  Patient reports that this has been an issue that has been intermittent for the past 1 to 2 months.  He does not notice it every day.  He reports that it is not worsened with exertion.  He does not feel short of breath.  There is no back pain.  He has not had any calf pain or leg swelling.  He has not had any dizziness.  No nausea or vomiting.  He noticed it most recently while he was trying to take a nap.  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  He denies family history of sudden cardiac death.  He denies personal or family history of DVT or PE.  He denies any drug use.  He reports that he has 1 cup of coffee per day.  Denies any other ionotropic agents.  He currently does not have this feeling.  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          Physical Exam   Triage Vital Signs: ED Triage Vitals  Encounter Vitals Group     BP 07/01/23 1530 (!) 155/92     Systolic BP Percentile --      Diastolic BP Percentile --      Pulse Rate 07/01/23 1530 73     Resp 07/01/23 1530 18     Temp 07/01/23 1530 98.5 F (36.9 C)     Temp src --      SpO2 07/01/23 1530 100 %     Weight 07/01/23 1527 198 lb (89.8 kg)     Height 07/01/23 1527 6\' 2"  (1.88 m)     Head Circumference --      Peak Flow --      Pain Score 07/01/23 1526 3     Pain Loc --      Pain Education --      Exclude from Growth Chart --     Most recent vital  signs: Vitals:   07/01/23 1530  BP: (!) 155/92  Pulse: 73  Resp: 18  Temp: 98.5 F (36.9 C)  SpO2: 100%    Physical Exam Vitals and nursing note reviewed.  Constitutional:      General: Awake and alert. No acute distress.  Resting comfortably on the stretcher.    Appearance: Normal appearance. The patient is normal weight.  HENT:     Head: Normocephalic and atraumatic.     Mouth: Mucous membranes are moist.  Eyes:     General: PERRL. Normal EOMs        Right eye: No discharge.        Left eye: No discharge.     Conjunctiva/sclera: Conjunctivae normal.  Cardiovascular:     Rate and Rhythm: Normal rate and regular rhythm.     Pulses: Normal pulses.  Equal in all 4 extremities. Pulmonary:     Effort: Pulmonary effort is normal. No respiratory distress.  Breath sounds: Normal breath sounds.  Abdominal:     Abdomen is soft. There is no abdominal tenderness. No rebound or guarding. No distention. Musculoskeletal:        General: No swelling. Normal range of motion.     Cervical back: Normal range of motion and neck supple.  Skin:    General: Skin is warm and dry.     Capillary Refill: Capillary refill takes less than 2 seconds.     Findings: No rash.  Neurological:     Mental Status: The patient is awake and alert.      ED Results / Procedures / Treatments   Labs (all labs ordered are listed, but only abnormal results are displayed) Labs Reviewed  BASIC METABOLIC PANEL - Abnormal; Notable for the following components:      Result Value   Glucose, Bld 100 (*)    All other components within normal limits  CBC  TSH  TROPONIN I (HIGH SENSITIVITY)  TROPONIN I (HIGH SENSITIVITY)     EKG     RADIOLOGY I independently reviewed and interpreted imaging and agree with radiologists findings.     PROCEDURES:  Critical Care performed:   Procedures   MEDICATIONS ORDERED IN ED: Medications - No data to display   IMPRESSION / MDM / ASSESSMENT AND PLAN /  ED COURSE  I reviewed the triage vital signs and the nursing notes.   Differential diagnosis includes, but is not limited to, electrolyte disarray, thyroid dysfunction, anxiety, arrhythmia, less likely ACS or PE.  Patient is awake and alert, hemodynamically stable and afebrile.  He is nontoxic in appearance.  He was placed on a cardiac monitor.  I reviewed the patient's chart.  Most recently seen in July for lumbar strain.  EKG obtained in triage demonstrates no acute ischemic signs or changes. I have personally reviewed and interpreted the EKG. HEART Score 1 (h/o HLD) for MACE in 6 weeks. PERC Score 0 for PE, VSS no hypoxia. Overall well-appearing. No ST/PR changes to suggest pericarditis. No pneumothorax, normal mediastinal width, no radiation to back.  Electrolytes within normal limits, does not appear to be dehydrated, normal thyroid function.  Discussed the option of follow-up with cardiology/outpatient provider for Holter monitor if his symptoms persist.  Discussed care plan, return precautions, and advised close outpatient follow-up. Patient agrees with plan of care.    Patient's presentation is most consistent with acute presentation with potential threat to life or bodily function.    FINAL CLINICAL IMPRESSION(S) / ED DIAGNOSES   Final diagnoses:  Palpitations     Rx / DC Orders   ED Discharge Orders     None        Note:  This document was prepared using Dragon voice recognition software and may include unintentional dictation errors.   Jackelyn Hoehn, PA-C 07/01/23 1841    Janith Lima, MD 07/01/23 [phone]

## 2023-07-01 NOTE — Discharge Instructions (Signed)
Your electrolytes, thyroid, heart tests were normal and your chest x-ray does not show any abnormalities.  A another test to consider is a Holter monitor which you can arrange with the cardiology team.  Please return for any new, worsening, or change in symptoms or other concerns.  It was a pleasure caring for you today.

## 2023-07-01 NOTE — ED Triage Notes (Addendum)
Pt to ed from work for headache, jittery, fast HR and chest pain. Pt is a Production designer, theatre/television/film. Pt does have diagnosed HX of anxiety.   Pt is caox4, in no acute distress and ambulatory in triage.  Pt then states he has been having this feeling for the past month. Pt has no pertinent medical HX.

## 2023-08-28 ENCOUNTER — Ambulatory Visit: Payer: Self-pay

## 2023-08-28 DIAGNOSIS — Z0289 Encounter for other administrative examinations: Secondary | ICD-10-CM

## 2023-08-28 LAB — POCT URINALYSIS DIPSTICK
Bilirubin, UA: NEGATIVE
Blood, UA: NEGATIVE
Glucose, UA: NEGATIVE
Ketones, UA: NEGATIVE
Leukocytes, UA: NEGATIVE
Nitrite, UA: NEGATIVE
Protein, UA: POSITIVE — AB
Spec Grav, UA: 1.015 (ref 1.010–1.025)
Urobilinogen, UA: 0.2 U/dL
pH, UA: 6 (ref 5.0–8.0)

## 2023-08-28 NOTE — Progress Notes (Signed)
Pt presents today to complete labs for FF physical. Mitchell Burke

## 2023-08-29 LAB — CMP12+LP+TP+TSH+6AC+CBC/D/PLT
ALT: 23 [IU]/L (ref 0–44)
AST: 16 [IU]/L (ref 0–40)
Albumin: 4.4 g/dL (ref 4.1–5.1)
Alkaline Phosphatase: 93 [IU]/L (ref 44–121)
BUN/Creatinine Ratio: 12 (ref 9–20)
BUN: 11 mg/dL (ref 6–20)
Basophils Absolute: 0 10*3/uL (ref 0.0–0.2)
Basos: 1 %
Bilirubin Total: 0.8 mg/dL (ref 0.0–1.2)
Calcium: 9.4 mg/dL (ref 8.7–10.2)
Chloride: 103 mmol/L (ref 96–106)
Chol/HDL Ratio: 5.4 {ratio} — ABNORMAL HIGH (ref 0.0–5.0)
Cholesterol, Total: 241 mg/dL — ABNORMAL HIGH (ref 100–199)
Creatinine, Ser: 0.94 mg/dL (ref 0.76–1.27)
EOS (ABSOLUTE): 0.1 10*3/uL (ref 0.0–0.4)
Eos: 2 %
Estimated CHD Risk: 1.1 times avg. — ABNORMAL HIGH (ref 0.0–1.0)
Free Thyroxine Index: 1.7 (ref 1.2–4.9)
GGT: 44 [IU]/L (ref 0–65)
Globulin, Total: 2.7 g/dL (ref 1.5–4.5)
Glucose: 100 mg/dL — ABNORMAL HIGH (ref 70–99)
HDL: 45 mg/dL (ref >39)
Hematocrit: 49.8 % (ref 37.5–51.0)
Hemoglobin: 16.4 g/dL (ref 13.0–17.7)
Immature Grans (Abs): 0 10*3/uL (ref 0.0–0.1)
Immature Granulocytes: 0 %
Iron: 177 ug/dL — ABNORMAL HIGH (ref 38–169)
LDH: 177 [IU]/L (ref 121–224)
LDL Chol Calc (NIH): 149 mg/dL — ABNORMAL HIGH (ref 0–99)
Lymphocytes Absolute: 1.7 10*3/uL (ref 0.7–3.1)
Lymphs: 31 %
MCH: 30.9 pg (ref 26.6–33.0)
MCHC: 32.9 g/dL (ref 31.5–35.7)
MCV: 94 fL (ref 79–97)
Monocytes Absolute: 0.4 10*3/uL (ref 0.1–0.9)
Monocytes: 7 %
Neutrophils Absolute: 3.2 10*3/uL (ref 1.4–7.0)
Neutrophils: 59 %
Phosphorus: 2.5 mg/dL — ABNORMAL LOW (ref 2.8–4.1)
Platelets: 266 10*3/uL (ref 150–450)
Potassium: 4.3 mmol/L (ref 3.5–5.2)
RBC: 5.31 x10E6/uL (ref 4.14–5.80)
RDW: 12 % (ref 11.6–15.4)
Sodium: 142 mmol/L (ref 134–144)
T3 Uptake Ratio: 28 % (ref 24–39)
T4, Total: 6 ug/dL (ref 4.5–12.0)
TSH: 0.819 u[IU]/mL (ref 0.450–4.500)
Total Protein: 7.1 g/dL (ref 6.0–8.5)
Triglycerides: 258 mg/dL — ABNORMAL HIGH (ref 0–149)
Uric Acid: 7.5 mg/dL (ref 3.8–8.4)
VLDL Cholesterol Cal: 47 mg/dL — ABNORMAL HIGH (ref 5–40)
WBC: 5.4 10*3/uL (ref 3.4–10.8)
eGFR: 109 mL/min/{1.73_m2} (ref >59)

## 2023-09-05 ENCOUNTER — Ambulatory Visit: Payer: Self-pay | Admitting: Physician Assistant

## 2023-09-05 VITALS — BP 134/95 | HR 75 | Resp 14 | Ht 74.0 in | Wt 198.0 lb

## 2023-09-05 DIAGNOSIS — E785 Hyperlipidemia, unspecified: Secondary | ICD-10-CM

## 2023-09-05 DIAGNOSIS — Z0289 Encounter for other administrative examinations: Secondary | ICD-10-CM

## 2023-09-05 LAB — POCT URINALYSIS DIPSTICK
Bilirubin, UA: NEGATIVE
Blood, UA: NEGATIVE
Glucose, UA: NEGATIVE
Ketones, UA: NEGATIVE
Leukocytes, UA: NEGATIVE
Nitrite, UA: NEGATIVE
Protein, UA: NEGATIVE
Spec Grav, UA: 1.01 (ref 1.010–1.025)
Urobilinogen, UA: 0.2 U/dL
pH, UA: 6 (ref 5.0–8.0)

## 2023-09-05 MED ORDER — ROSUVASTATIN CALCIUM 20 MG PO TABS: 20.0000 mg | ORAL_TABLET | Freq: Every day | ORAL | 3 refills | Status: DC

## 2023-09-05 MED ORDER — HYDROCORTISONE ACETATE 25 MG RE SUPP: 25.0000 mg | Freq: Two times a day (BID) | RECTAL | 0 refills | Status: DC

## 2023-09-05 MED ORDER — ESOMEPRAZOLE MAGNESIUM 40 MG PO CPDR: 40.0000 mg | DELAYED_RELEASE_CAPSULE | Freq: Every day | ORAL | 3 refills | Status: AC

## 2023-09-05 NOTE — Progress Notes (Signed)
Pt presents today to complete FF physical, Pt requesting medication for acid reflux and anusol refil.

## 2023-09-05 NOTE — Progress Notes (Signed)
[facility]  ____________________________________________   None    (approximate)  I have reviewed the triage vital signs and the nursing notes.   HISTORY  Chief Complaint Annual Exam   HPI Mitchell Burke is a 32 y.o. male patient presents for annual physical exam.  Patient requests refill of Anusol for hemorrhoids.  Patient also relates history of GERD.         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  Prior to Admission medications   Medication Sig Start Date End Date Taking? Authorizing Provider  cyclobenzaprine (FLEXERIL) 5 MG tablet Take 1 tablet (5 mg total) by mouth 3 (three) times daily as needed. 04/28/23  Yes Menshew, Charlesetta Ivory, PA-C  diclofenac (VOLTAREN) 75 MG EC tablet Take 1 tablet (75 mg total) by mouth 2 (two) times daily. 04/28/23  Yes Menshew, Charlesetta Ivory, PA-C  hydrocortisone 1 % ointment Apply 1 Application topically 2 (two) times daily. 08/08/22   Joni Reining, PA-C  rosuvastatin (CRESTOR) 20 MG tablet Take 1 tablet (20 mg total) by mouth daily. 09/29/22   Joni Reining, PA-C    Allergies Patient has no known allergies.  No family history on file.  Social History Social History   Tobacco Use   Smoking status: Some Days    Types: Cigarettes   Smokeless tobacco: Never  Substance Use Topics   Alcohol use: Not Currently    Comment: socially   Drug use: No    Comment: uses Suboxone    Review of Systems Constitutional: No fever/chills Eyes: No visual changes. ENT: No sore  throat. Cardiovascular: Denies chest pain. Respiratory: Denies shortness of breath. Gastrointestinal: No abdominal pain.  No nausea, no vomiting.  No diarrhea.  No constipation.  GERD Genitourinary: Negative for dysuria. Musculoskeletal: Negative for back pain. Skin: Negative for rash. Neurological: Negative for headaches, focal weakness or numbness. Psychiatric: Anxiety  ____________________________________________   PHYSICAL EXAM:  VITAL SIGNS: BP 134/95  Pulse 75  Resp 14  Weight 198 lb (89.8 kg)  Height 6\' 2"  (1.88 m)   BMI 25.42 kg/m2  BSA 2.17 m2   Constitutional: Alert and oriented. Well appearing and in no acute distress. Eyes: Conjunctivae are normal. PERRL. EOMI. Head: Atraumatic. Nose: No congestion/rhinnorhea. Mouth/Throat: Mucous membranes are moist.  Oropharynx non-erythematous. Neck: No stridor.  No cervical spine tenderness to palpation. Hematological/Lymphatic/Immunilogical: No cervical lymphadenopathy. Cardiovascular: Normal rate, regular rhythm. Grossly normal heart sounds.  Good peripheral circulation. Respiratory: Normal respiratory effort.  No retractions. Lungs CTAB. Gastrointestinal: Soft and nontender. No distention. No abdominal bruits. No CVA tenderness. Genitourinary: Deferred Musculoskeletal: No lower extremity tenderness nor edema.  No joint effusions. Neurologic:  Normal speech and language. No gross focal neurologic deficits are appreciated. No gait instability. Skin:  Skin is warm, dry and intact. No rash noted. Psychiatric: Mood and affect are normal. Speech and behavior are normal.  ____________________________________________   LABS _          Component Ref Range & Units 8 d ago (08/28/23) 4 mo  ago (04/28/23) 1 yr ago (08/08/22) 1 yr ago (09/06/21) 2 yr ago (12/09/20) 2 yr ago (11/25/20) 2 yr ago (09/06/20)  Color, UA dark yellow  yellow Yellow Yellow R yellow   Clarity, UA clear  clear Clear  clear   Glucose, UA Negative  Negative  Negative Negative  Negative   Bilirubin, UA neg  neg Negative Negative R negative   Ketones, UA neg  neg Negative Negative R negative   Spec Grav, UA 1.010 - 1.025 1.015  1.010 1.025 1.025 R 1.015   Blood, UA neg  neg Negative Trace Abnormal  R negative   pH, UA 5.0 - 8.0 6.0  6.5 6.0 6.0 R 6.0   Protein, UA Negative Positive Abnormal   Negative Negative Negative R Negative   Comment: 1+  Urobilinogen, UA 0.2 or 1.0 E.U./dL 0.2  0.2 0.2  0.2   Nitrite, UA neg  neg Negative Negative R negative   Leukocytes, UA Negative Negative  Negative Negative Negative Negative   Appearance  CLEAR Abnormal  R  Normal  light CLEAR Abnormal  R  Odor    None     Resulting Agency  [facility]   [facility]  [facility]         Specimen Collected: 08/28/23 10:35 Last Resulted: 08/28/23 10:35      Lab Flowsheet      Order Details      View Encounter      Lab and Collection Details      Routing      Result History    View All Conversations on this Encounter      R=Reference range differs from displayed range      Result Care Coordination   Patient Communication   Add Comments   Seen Back to Top    Other Results from 08/28/2023   Contains abnormal data CMP12+LP+TP+TSH+6AC+CBC/D/Plt Order: [id number] Status: Final result      Visible to patient: Yes (seen)      Next appt: None      Dx: Encounter for physical examination re...    0 Result Notes            Component Ref Range & Units 8 d ago (08/28/23) 2 mo ago (07/01/23) 2 mo ago (07/01/23) 2 mo ago (07/01/23) 9 mo ago (11/14/22) 1 yr ago (08/08/22) 1 yr ago (09/06/21)  Glucose 70 - 99 mg/dL 191 High  478 High  CM    93 99  Uric Acid 3.8 - 8.4 mg/dL 7.5     5.6 CM 5.7 CM  Comment:            Therapeutic target for gout patients: <6.0  BUN 6 - 20 mg/dL 11 17    16 19   Creatinine, Ser 0.76 - 1.27 mg/dL 2.95 6.21 R    3.08 6.57  eGFR >59 mL/min/1.73 109     80 89  BUN/Creatinine Ratio 9 - 20 12     13 17    Sodium 134 - 144 mmol/L 142 138 R    137 138  Potassium 3.5 - 5.2 mmol/L 4.3 3.7 R    4.1 4.3  Chloride 96 - 106 mmol/L 103 103 R    99 98  Calcium 8.7 - 10.2 mg/dL 9.4 9.2 R    9.6 9.9  Phosphorus 2.8 - 4.1 mg/dL 2.5 Low      3.5 3.4  Total Protein 6.0 - 8.5 g/dL 7.1     7.5 7.3  Albumin 4.1 - 5.1 g/dL 4.4     4.9 4.8 R  Globulin, Total 1.5 - 4.5 g/dL 2.7     2.6 2.5  Bilirubin Total 0.0 - 1.2 mg/dL 0.8     0.5 0.5  Alkaline Phosphatase 44 - 121 IU/L 93     83 92  LDH 121 - 224 IU/L 177     189 179  AST 0 - 40 IU/L 16     15 27   ALT 0 - 44 IU/L 23     21 26   GGT 0 - 65 IU/L 44     32 37  Iron 38 - 169 ug/dL 528 High      413 High  107  Cholesterol, Total 100 - 199 mg/dL 244 High     010 272 High  240 High   Triglycerides 0 - 149 mg/dL 536 High     644 034 High  127  HDL >39 mg/dL 45    53 59 58  VLDL Cholesterol Cal 5 - 40 mg/dL 47 High     25 34 23  LDL Chol Calc (NIH) 0 - 99 mg/dL 742 High     87 595 High  159 High   Chol/HDL Ratio 0.0 - 5.0 ratio 5.4 High     3.1 CM 4.2 CM 4.1 CM  Comment:                                   T. Chol/HDL Ratio                                             Men  Women                               1/2 Avg.Risk  3.4    3.3                                   Avg.Risk  5.0    4.4                                2X Avg.Risk  9.6    7.1                                3X Avg.Risk 23.4   11.0  Estimated CHD Risk 0.0 - 1.0 times avg. 1.1 High      0.8 CM 0.8 CM  Comment: The CHD Risk is based on the T. Chol/HDL ratio. Other factors affect CHD Risk such as hypertension, smoking, diabetes, severe obesity, and family history of premature CHD.  TSH 0.450 - 4.500 uIU/mL 0.819   0.793 R, CM  0.918 0.769  T4, Total 4.5 - 12.0 ug/dL 6.0     8.2 7.3  T3 Uptake Ratio 24 - 39 % 28     30 29   Free Thyroxine Index 1.2 - 4.9 1.7     2.5 2.1  WBC 3.4 - 10.8 x10E3/uL 5.4  6.2 R   5.9 5.7  Comment: **Effective September 03, 2023 profile  [id number] WBC  will be made**   non-orderable as a stand-alone order code.  RBC 4.14 - 5.80 x10E6/uL 5.31  5.06 R   5.08 5.14  Hemoglobin 13.0 - 17.7 g/dL 91.4  78.2 R   95.6 21.3  Hematocrit 37.5 - 51.0 % 49.8  46.0 R   46.5 45.8  MCV 79 - 97 fL 94  90.9 R   92 89  MCH 26.6 - 33.0 pg 30.9  31.6 R   30.5 30.5  MCHC 31.5 - 35.7 g/dL 08.6  57.8 R   46.9 62.9  RDW 11.6 - 15.4 % 12.0  12.5 R   12.4 12.1  Platelets 150 - 450 x10E3/uL 266  259 R   241 242  Neutrophils Not Estab. % 59     52 50  Lymphs Not Estab. % 31     35 42  Monocytes Not Estab. % 7     5 7   Eos Not Estab. % 2     7 0  Basos Not Estab. % 1     1 1   Neutrophils Absolute 1.4 - 7.0 x10E3/uL 3.2     3.1 2.9  Lymphocytes Absolute 0.7 - 3.1 x10E3/uL 1.7     2.1 2.4  Monocytes Absolute 0.1 - 0.9 x10E3/uL 0.4     0.3 0.4  EOS (ABSOLUTE) 0.0 - 0.4 x10E3/uL 0.1     0.4 0.0  Basophils Absolute 0.0 - 0.2 x10E3/uL 0.0     0.0 0.0  Immature Granulocytes Not Estab. % 0     0 0  Immature Grans (Abs)             ___________________________________________  EKG  Normal sinus rhythm at 64 bpm ____________________________________________    ____________________________________________   INITIAL IMPRESSION / ASSESSMENT AND PLAN   As part of my medical decision making, I reviewed the following data within the electronic MEDICAL RECORD NUMBER  No acute findings on physical exam and EKG.  Labs reveal increased lipid profile which is secondary to noncompliance with statin.  Will restart Crestor 40 mg daily.  Repeat lipid profile in 3 months.            ____________________________________________   FINAL CLINICAL IMPRESSION Well exam   ED Discharge Orders     None        Note:  This document was prepared using Dragon voice recognition software and may include unintentional dictation errors. C

## 2023-09-10 ENCOUNTER — Other Ambulatory Visit: Payer: Self-pay | Admitting: Physician Assistant

## 2023-09-10 MED ORDER — LIDOCAINE VISCOUS HCL 2 % MT SOLN: 15.0000 mL | Freq: Two times a day (BID) | OROMUCOSAL | 0 refills | Status: DC | PRN

## 2023-09-10 MED ORDER — LIDOCAINE VISCOUS HCL 2 % MT SOLN: 5.0000 mL | Freq: Four times a day (QID) | OROMUCOSAL | 0 refills | Status: DC | PRN

## 2023-09-11 ENCOUNTER — Other Ambulatory Visit: Payer: Self-pay | Admitting: Physician Assistant

## 2023-09-11 ENCOUNTER — Other Ambulatory Visit: Payer: Self-pay

## 2023-09-11 DIAGNOSIS — K649 Unspecified hemorrhoids: Secondary | ICD-10-CM

## 2023-09-11 MED ORDER — HYDROCORTISONE ACETATE 25 MG RE SUPP: 25.0000 mg | Freq: Two times a day (BID) | RECTAL | 0 refills | Status: DC

## 2023-09-12 MED ORDER — HYDROCORTISONE (PERIANAL) 2.5 % EX CREA
1.0000 | TOPICAL_CREAM | Freq: Two times a day (BID) | CUTANEOUS | 0 refills | Status: AC
Start: 2023-09-12 — End: ?

## 2023-09-19 ENCOUNTER — Ambulatory Visit
Admission: EM | Admit: 2023-09-19 | Discharge: 2023-09-19 | Disposition: A | Discharge status: Home / Self Care | Payer: 59 | Attending: Emergency Medicine | Admitting: Emergency Medicine

## 2023-09-19 DIAGNOSIS — J069 Acute upper respiratory infection, unspecified: Secondary | ICD-10-CM | POA: Diagnosis not present

## 2023-09-19 LAB — POCT RAPID STREP A (OFFICE): Rapid Strep A Screen: NEGATIVE

## 2023-09-19 LAB — POC COVID19/FLU A&B COMBO
Covid Antigen, POC: NEGATIVE
Influenza A Antigen, POC: NEGATIVE
Influenza B Antigen, POC: NEGATIVE

## 2023-09-19 NOTE — ED Provider Notes (Signed)
Mitchell Burke    CSN: [id number] Arrival date & time: 09/19/23  0865      History   Chief Complaint Chief Complaint  Patient presents with   URI    HPI Mitchell Burke is a 32 y.o. male.  Patient presents with 2-day history of subjective fever at night, sore throat, body aches, sinus pressure, congestion.  No cough, shortness of breath, chest pain, vomiting, diarrhea.  Treating symptoms with Tylenol and Sudafed.  The history is provided by the patient and medical records.    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 No pertinent surgical history.     Home Medications    Prior to Admission medications   Medication Sig Start Date End Date Taking? Authorizing Provider  cyclobenzaprine (FLEXERIL) 5 MG tablet Take 1 tablet (5 mg total) by mouth 3 (three) times daily as needed. Patient not taking: Reported on 09/19/2023 04/28/23   Menshew, Charlesetta Ivory, PA-C  diclofenac (VOLTAREN) 75 MG EC tablet Take 1 tablet (75 mg total) by mouth 2 (two) times daily. 04/28/23   Menshew, Charlesetta Ivory, PA-C  esomeprazole (NEXIUM) 40 MG capsule Take 1 capsule (40 mg total) by mouth daily. 09/05/23   Joni Reining, PA-C  hydrocortisone (ANUSOL-HC) 2.5 % rectal cream Place 1 Application rectally 2 (two) times daily. 09/12/23   Joni Reining, PA-C  hydrocortisone 1 % ointment Apply 1 Application topically 2 (two) times daily. 08/08/22   Joni Reining, PA-C  lidocaine (XYLOCAINE) 2 % solution Use as directed 15 mLs in the mouth or throat 2 (two) times daily as needed (Coat Anusol suppository with  viscous lidocaine before insertion). Patient not taking: Reported on 09/19/2023 09/10/23   Joni Reining, PA-C  rosuvastatin (CRESTOR) 20 MG tablet Take 1 tablet (20 mg total) by mouth daily. 09/05/23   Joni Reining, PA-C    Family History History reviewed. No pertinent family history.  Social History Social History   Tobacco Use   Smoking status: Some Days    Types: Cigarettes   Smokeless tobacco: Never  Substance Use Topics   Alcohol use: Not Currently    Comment: socially   Drug use: No    Comment: uses Suboxone     Allergies   Patient has no known allergies.   Review of Systems Review of Systems  Constitutional:  Positive for fever. Negative for chills.  HENT:  Positive for congestion, sinus pressure and sore throat. Negative for ear pain.   Respiratory:  Negative for cough and shortness of breath.   Cardiovascular:  Negative for chest pain and palpitations.  Gastrointestinal:  Negative for diarrhea and vomiting.     Physical Exam Triage Vital Signs ED Triage Vitals  Encounter Vitals Group     BP 09/19/23 0823 131/79     Systolic BP Percentile --      Diastolic BP Percentile --      Pulse Rate 09/19/23 0823 100     Resp 09/19/23 0823 14     Temp 09/19/23 0823 98.8 F (37.1 C)  Temp src --      SpO2 09/19/23 0823 98 %     Weight --      Height --      Head Circumference --      Peak Flow --      Pain Score 09/19/23 0821 2     Pain Loc --      Pain Education --      Exclude from Growth Chart --    No data found.  Updated Vital Signs BP 131/79   Pulse 100   Temp 98.8 F (37.1 C)   Resp 14   SpO2 98%   Visual Acuity Right Eye Distance:   Left Eye Distance:   Bilateral Distance:    Right Eye Near:   Left Eye Near:    Bilateral Near:     Physical Exam Constitutional:      General: He is not in acute distress. HENT:     Right Ear: Tympanic membrane normal.     Left Ear: Tympanic membrane normal.     Nose: Congestion and rhinorrhea  present.     Mouth/Throat:     Mouth: Mucous membranes are moist.     Pharynx: Oropharynx is clear.  Cardiovascular:     Rate and Rhythm: Normal rate and regular rhythm.     Heart sounds: Normal heart sounds.  Pulmonary:     Effort: Pulmonary effort is normal. No respiratory distress.     Breath sounds: Normal breath sounds.  Skin:    General: Skin is warm and dry.  Neurological:     Mental Status: He is alert.      UC Treatments / Results  Labs (all labs ordered are listed, but only abnormal results are displayed) Labs Reviewed  POCT RAPID STREP A (OFFICE)  POC COVID19/FLU A&B COMBO    EKG   Radiology No results found.  Procedures Procedures (including critical care time)  Medications Ordered in UC Medications - No data to display  Initial Impression / Assessment and Plan / UC Course  I have reviewed the triage vital signs and the nursing notes.  Pertinent labs & imaging results that were available during my care of the patient were reviewed by me and considered in my medical decision making (see chart for details).    Viral URI.  Rapid strep negative.  Rapid COVID and flu negative.  Discussed symptomatic treatment including Tylenol or ibuprofen as needed for fever or discomfort, plain Mucinex as needed for congestion, rest, hydration.  Instructed patient to follow-up with PCP if not improving.  ED precautions given.  Patient agrees to plan of care.   Final Clinical Impressions(s) / UC Diagnoses   Final diagnoses:  Viral URI     Discharge Instructions      The strep, COVID, and flu tests are negative.   Take Tylenol or ibuprofen as needed for fever or discomfort.  Take plain Mucinex as needed for congestion.  Rest and keep yourself hydrated.    Follow-up with your primary care provider if your symptoms are not improving.         ED Prescriptions   None    PDMP not reviewed this encounter.   Mickie Bail, NP 09/19/23 [phone]

## 2023-09-19 NOTE — ED Triage Notes (Addendum)
Patient to Urgent Care with complaints of facial pain and pressure/ sore throat/ body aches/ possible fever during the night.  Symptoms started two days ago. Multiple sick contacts at work/ home.  Meds: sudafed/ tylenol.

## 2023-09-19 NOTE — Discharge Instructions (Addendum)
 The strep, COVID, and flu tests are negative.   Take Tylenol or ibuprofen as needed for fever or discomfort.  Take plain Mucinex as needed for congestion.  Rest and keep yourself hydrated.    Follow-up with your primary care provider if your symptoms are not improving.

## 2023-11-28 ENCOUNTER — Other Ambulatory Visit: Payer: Self-pay

## 2023-11-28 DIAGNOSIS — Z0283 Encounter for blood-alcohol and blood-drug test: Secondary | ICD-10-CM

## 2023-11-28 NOTE — Progress Notes (Signed)
 Pt presents today to complete Random UDS & ETOH for COB.  ETOH CLEARED. UDS PENDING WITH [facility].

## 2024-05-12 DIAGNOSIS — M778 Other enthesopathies, not elsewhere classified: Secondary | ICD-10-CM | POA: Insufficient documentation

## 2024-05-12 DIAGNOSIS — M25531 Pain in right wrist: Secondary | ICD-10-CM | POA: Insufficient documentation

## 2024-05-22 DIAGNOSIS — M654 Radial styloid tenosynovitis [de Quervain]: Secondary | ICD-10-CM | POA: Insufficient documentation

## 2024-06-27 ENCOUNTER — Ambulatory Visit: Admission: EM | Admit: 2024-06-27 | Discharge: 2024-06-27 | Disposition: A | Discharge status: Home / Self Care

## 2024-06-27 ENCOUNTER — Encounter: Payer: Self-pay | Admitting: Emergency Medicine

## 2024-06-27 DIAGNOSIS — J069 Acute upper respiratory infection, unspecified: Secondary | ICD-10-CM | POA: Diagnosis not present

## 2024-06-27 HISTORY — DX: Disorder of thyroid, unspecified: E07.9

## 2024-06-27 LAB — POCT RAPID STREP A (OFFICE): Rapid Strep A Screen: NEGATIVE

## 2024-06-27 MED ORDER — AZITHROMYCIN 250 MG PO TABS: 250.0000 mg | ORAL_TABLET | Freq: Every day | ORAL | 0 refills | Status: DC

## 2024-06-27 NOTE — ED Triage Notes (Signed)
 Patient reports sore throat and sinus pressure that started Sunday. Patient also complains of headache, runny nose and cough that started Tuesday. Rates pain 3/10. Patient has not taken anything for symptoms.

## 2024-06-27 NOTE — ED Provider Notes (Signed)
 Mitchell Burke    CSN: [id number] Arrival date & time: 06/27/24  1745      History   Chief Complaint Chief Complaint  Patient presents with   Nasal Congestion   Sore Throat   Headache   Cough    HPI Mitchell Burke is a 33 y.o. male.   Patient presents for evaluation of nasal congestion, sore throat, intermittent headaches, nonproductive cough, sinus pressure behind the eyes into the bridge of the nose beginning 7 days ago.  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  Has attempted use of Tylenol .  Tolerable to food and liquids.  Denies fever, shortness of breath or wheezing.  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 No pertinent surgical history.     Home Medications    Prior to Admission medications   Medication Sig Start Date End Date Taking? Authorizing Provider  azithromycin  (ZITHROMAX ) 250 MG tablet Take 1 tablet (250 mg total) by mouth daily. Take first 2 tablets together, then 1 every day until finished. 06/27/24  Yes Stoney Karczewski R, NP  methimazole (TAPAZOLE) 5 MG tablet Take 5 mg by mouth every morning. 03/18/24  Yes [provider]  cyclobenzaprine  (FLEXERIL ) 5 MG tablet Take 1 tablet (5 mg total) by mouth 3 (three) times daily as needed. Patient not taking: Reported on 09/19/2023 04/28/23   Menshew, Candida LULLA Kings, PA-C  diclofenac  (VOLTAREN ) 75 MG EC tablet Take 1 tablet (75 mg total) by mouth 2 (two) times daily. 04/28/23   Menshew, Candida LULLA Kings, PA-C  esomeprazole  (NEXIUM ) 40 MG capsule Take 1 capsule (40 mg  total) by mouth daily. 09/05/23   Claudene Tanda POUR, PA-C  hydrocortisone  (ANUSOL -HC) 2.5 % rectal cream Place 1 Application rectally 2 (two) times daily. 09/12/23   Claudene Tanda POUR, PA-C  hydrocortisone  1 % ointment Apply 1 Application topically 2 (two) times daily. 08/08/22   Claudene Tanda POUR, PA-C  lidocaine  (XYLOCAINE ) 2 % solution Use as directed 15 mLs in the mouth or throat 2 (two) times daily as needed (Coat Anusol  suppository with viscous lidocaine  before insertion). Patient not taking: Reported on 09/19/2023 09/10/23   Claudene Tanda POUR, PA-C  rosuvastatin  (CRESTOR ) 20 MG tablet Take 1 tablet (20 mg total) by mouth daily. 09/05/23   Claudene Tanda POUR, PA-C    Family History History reviewed. No pertinent family history.  Social History Social History   Tobacco Use   Smoking status: Some Days    Types: Cigarettes   Smokeless tobacco: Never  Substance Use Topics   Alcohol use: Not Currently    Comment: socially   Drug use: No    Comment: uses Suboxone     Allergies   Patient has no known allergies.   Review of Systems Review of Systems   Physical Exam Triage Vital Signs ED Triage Vitals  Encounter Vitals Group     BP 06/27/24 1757 120/82     Girls Systolic BP Percentile --      Girls Diastolic BP Percentile --  Boys Systolic BP Percentile --      Boys Diastolic BP Percentile --      Pulse Rate 06/27/24 1757 71     Resp 06/27/24 1757 18     Temp 06/27/24 1757 98.6 F (37 C)     Temp Source 06/27/24 1757 Oral     SpO2 06/27/24 1757 99 %     Weight --      Height --      Head Circumference --      Peak Flow --      Pain Score 06/27/24 1753 3     Pain Loc --      Pain Education --      Exclude from Growth Chart --    No data found.  Updated Vital Signs BP 120/82 (BP Location: Left Arm)   Pulse 71   Temp 98.6 F (37 C) (Oral)   Resp 18   SpO2 99%   Visual Acuity Right Eye Distance:   Left Eye Distance:   Bilateral Distance:    Right Eye Near:    Left Eye Near:    Bilateral Near:     Physical Exam Constitutional:      Appearance: Normal appearance.  HENT:     Right Ear: Tympanic membrane, ear canal and external ear normal.     Left Ear: Tympanic membrane, ear canal and external ear normal.     Nose: Congestion present.     Mouth/Throat:     Pharynx: No oropharyngeal exudate or posterior oropharyngeal erythema.  Eyes:     Extraocular Movements: Extraocular movements intact.  Cardiovascular:     Rate and Rhythm: Normal rate and regular rhythm.     Pulses: Normal pulses.     Heart sounds: Normal heart sounds.  Pulmonary:     Effort: Pulmonary effort is normal.     Breath sounds: Normal breath sounds.  Neurological:     Mental Status: He is alert and oriented to person, place, and time. Mental status is at baseline.      UC Treatments / Results  Labs (all labs ordered are listed, but only abnormal results are displayed) Labs Reviewed  POCT RAPID STREP A (OFFICE) - Normal    EKG   Radiology No results found.  Procedures Procedures (including critical care time)  Medications Ordered in UC Medications - No data to display  Initial Impression / Assessment and Plan / UC Course  I have reviewed the triage vital signs and the nursing notes.  Pertinent labs & imaging results that were available during my care of the patient were reviewed by me and considered in my medical decision making (see chart for details).  Acute URI  Patient is in no signs of distress nor toxic appearing.  Vital signs are stable.  Low suspicion for pneumonia, pneumothorax or bronchitis and therefore will defer imaging.  Rapid strep test negative, viral testing deferred due to timeline of illness, symptoms most likely viral however as symptoms have persisted for 7 days empirically placed on azithromycin . May use additional over-the-counter medications as needed for supportive care.  May follow-up with urgent care as needed if symptoms persist  or worsen.  Final Clinical Impressions(s) / UC Diagnoses   Final diagnoses:  Acute URI     Discharge Instructions      Your symptoms are most likely related to a virus, take azithromycin  as directed to provide coverage for bacteria which may be causing symptoms to linger as they have persisted for  7 days without signs of resolved  Rapid strep test is negative for bacteria    You can take Tylenol  and/or Ibuprofen as needed for fever reduction and pain relief.   For cough: honey 1/2 to 1 teaspoon (you can dilute the honey in water or another fluid).  You can also use guaifenesin and dextromethorphan for cough. You can use a humidifier for chest congestion and cough.  If you don't have a humidifier, you can sit in the bathroom with the hot shower running.      For sore throat: try warm salt water gargles, cepacol lozenges, throat spray, warm tea or water with lemon/honey, popsicles or ice, or OTC cold relief medicine for throat discomfort.   For congestion: take a daily anti-histamine like Zyrtec, Claritin, and a oral decongestant, such as pseudoephedrine.  You can also use Flonase 1-2 sprays in each nostril daily.   It is important to stay hydrated: drink plenty of fluids (water, gatorade/powerade/pedialyte, juices, or teas) to keep your throat moisturized and help further relieve irritation/discomfort.    ED Prescriptions     Medication Sig Dispense Auth. Provider   azithromycin  (ZITHROMAX ) 250 MG tablet Take 1 tablet (250 mg total) by mouth daily. Take first 2 tablets together, then 1 every day until finished. 6 tablet Ayianna Darnold R, NP      PDMP not reviewed this encounter.   Teresa Shelba SAUNDERS, NP 06/27/24 1925

## 2024-06-27 NOTE — Discharge Instructions (Signed)
 Your symptoms are most likely related to a virus, take azithromycin  as directed to provide coverage for bacteria which may be causing symptoms to linger as they have persisted for 7 days without signs of resolved  Rapid strep test is negative for bacteria    You can take Tylenol  and/or Ibuprofen as needed for fever reduction and pain relief.   For cough: honey 1/2 to 1 teaspoon (you can dilute the honey in water or another fluid).  You can also use guaifenesin and dextromethorphan for cough. You can use a humidifier for chest congestion and cough.  If you don't have a humidifier, you can sit in the bathroom with the hot shower running.      For sore throat: try warm salt water gargles, cepacol lozenges, throat spray, warm tea or water with lemon/honey, popsicles or ice, or OTC cold relief medicine for throat discomfort.   For congestion: take a daily anti-histamine like Zyrtec, Claritin, and a oral decongestant, such as pseudoephedrine.  You can also use Flonase 1-2 sprays in each nostril daily.   It is important to stay hydrated: drink plenty of fluids (water, gatorade/powerade/pedialyte, juices, or teas) to keep your throat moisturized and help further relieve irritation/discomfort.

## 2024-09-03 ENCOUNTER — Ambulatory Visit: Payer: Self-pay

## 2024-09-03 DIAGNOSIS — Z0000 Encounter for general adult medical examination without abnormal findings: Secondary | ICD-10-CM

## 2024-09-03 LAB — POCT URINALYSIS DIPSTICK
Bilirubin, UA: NEGATIVE
Blood, UA: NEGATIVE
Glucose, UA: NEGATIVE
Ketones, UA: NEGATIVE
Leukocytes, UA: NEGATIVE
Nitrite, UA: NEGATIVE
Protein, UA: POSITIVE — AB
Spec Grav, UA: 1.015 (ref 1.010–1.025)
Urobilinogen, UA: 0.2 U/dL
pH, UA: 7 (ref 5.0–8.0)

## 2024-09-04 LAB — CMP12+LP+TP+TSH+6AC+CBC/D/PLT
ALT: 21 IU/L (ref 0–44)
AST: 31 IU/L (ref 0–40)
Albumin: 4.6 g/dL (ref 4.1–5.1)
Alkaline Phosphatase: 83 IU/L (ref 47–123)
BUN/Creatinine Ratio: 11 (ref 9–20)
BUN: 12 mg/dL (ref 6–20)
Basophils Absolute: 0 x10E3/uL (ref 0.0–0.2)
Basos: 0 %
Bilirubin Total: 0.6 mg/dL (ref 0.0–1.2)
Calcium: 9.6 mg/dL (ref 8.7–10.2)
Chloride: 101 mmol/L (ref 96–106)
Chol/HDL Ratio: 4.2 ratio (ref 0.0–5.0)
Cholesterol, Total: 224 mg/dL — ABNORMAL HIGH (ref 100–199)
Creatinine, Ser: 1.1 mg/dL (ref 0.76–1.27)
EOS (ABSOLUTE): 0 x10E3/uL (ref 0.0–0.4)
Eos: 0 %
Estimated CHD Risk: 0.8 times avg. (ref 0.0–1.0)
Free Thyroxine Index: 1.8 (ref 1.2–4.9)
GGT: 28 IU/L (ref 0–65)
Globulin, Total: 2.7 g/dL (ref 1.5–4.5)
Glucose: 88 mg/dL (ref 70–99)
HDL: 53 mg/dL (ref >39)
Hematocrit: 52 % — ABNORMAL HIGH (ref 37.5–51.0)
Hemoglobin: 15.9 g/dL (ref 13.0–17.7)
Immature Grans (Abs): 0 x10E3/uL (ref 0.0–0.1)
Immature Granulocytes: 0 %
Iron: 58 ug/dL (ref 38–169)
LDH: 206 IU/L (ref 121–224)
LDL Chol Calc (NIH): 145 mg/dL — ABNORMAL HIGH (ref 0–99)
Lymphocytes Absolute: 1.7 x10E3/uL (ref 0.7–3.1)
Lymphs: 33 %
MCH: 24.5 pg — ABNORMAL LOW (ref 26.6–33.0)
MCHC: 30.6 g/dL — ABNORMAL LOW (ref 31.5–35.7)
MCV: 80 fL (ref 79–97)
Monocytes Absolute: 0.4 x10E3/uL (ref 0.1–0.9)
Monocytes: 8 %
Neutrophils Absolute: 3 x10E3/uL (ref 1.4–7.0)
Neutrophils: 59 %
Phosphorus: 2.8 mg/dL (ref 2.8–4.1)
Platelets: 291 x10E3/uL (ref 150–450)
Potassium: 4.1 mmol/L (ref 3.5–5.2)
RBC: 6.5 x10E6/uL — ABNORMAL HIGH (ref 4.14–5.80)
RDW: 18.3 % — ABNORMAL HIGH (ref 11.6–15.4)
Sodium: 140 mmol/L (ref 134–144)
T3 Uptake Ratio: 28 % (ref 24–39)
T4, Total: 6.5 ug/dL (ref 4.5–12.0)
TSH: 0.853 u[IU]/mL (ref 0.450–4.500)
Total Protein: 7.3 g/dL (ref 6.0–8.5)
Triglycerides: 148 mg/dL (ref 0–149)
Uric Acid: 7.3 mg/dL (ref 3.8–8.4)
VLDL Cholesterol Cal: 26 mg/dL (ref 5–40)
WBC: 5.1 x10E3/uL (ref 3.4–10.8)
eGFR: 90 mL/min/1.73 (ref >59)

## 2024-09-09 ENCOUNTER — Ambulatory Visit: Payer: Self-pay | Admitting: Physician Assistant

## 2024-09-09 ENCOUNTER — Encounter: Payer: Self-pay | Admitting: Physician Assistant

## 2024-09-09 VITALS — BP 134/84 | HR 76 | Temp 97.7°F | Resp 16 | Ht 74.0 in | Wt 200.0 lb

## 2024-09-09 DIAGNOSIS — Z0000 Encounter for general adult medical examination without abnormal findings: Secondary | ICD-10-CM

## 2024-09-09 NOTE — Progress Notes (Signed)
 Pt presents today to complete FF physical. Mitchell Burke

## 2024-09-09 NOTE — Progress Notes (Signed)
 [facility]  ____________________________________________   None    (approximate)  I have reviewed the triage vital signs and the nursing notes.   HISTORY  Chief Complaint Annual Exam   HPI Mitchell Burke is a 33 y.o. 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  Patient admits to noncompliance to statin for lipid control.  State he stopped taking statins approximately 4 to 5 months ago secondary to side effects consisting of memory loss and increased anxiety.  Patient states that his complaint is.  After stopping the statin.         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  Prior to Admission medications   Medication Sig Start Date End Date Taking? Authorizing Provider  azithromycin  (ZITHROMAX ) 250 MG tablet Take 1 tablet (250 mg total) by mouth daily. Take first 2 tablets together, then 1 every day until finished. 06/27/24   White, Shelba SAUNDERS, NP  cyclobenzaprine  (FLEXERIL ) 5 MG tablet Take 1 tablet (5 mg total) by mouth 3 (three) times daily as needed. Patient not taking: Reported on 09/19/2023 04/28/23   Menshew, Candida LULLA Kings, PA-C  diclofenac  (VOLTAREN ) 75 MG EC tablet Take 1 tablet (75 mg total) by mouth 2 (two) times daily. 04/28/23   Menshew, Candida LULLA Kings, PA-C  esomeprazole  (NEXIUM ) 40 MG capsule Take 1 capsule (40 mg total) by mouth daily. 09/05/23   Claudene Tanda POUR, PA-C  hydrocortisone  (ANUSOL -HC) 2.5 % rectal cream Place 1 Application rectally 2 (two) times daily.  09/12/23   Claudene Tanda POUR, PA-C  hydrocortisone  1 % ointment Apply 1 Application topically 2 (two) times daily. 08/08/22   Claudene Tanda POUR, PA-C  lidocaine  (XYLOCAINE ) 2 % solution Use as directed 15 mLs in the mouth or throat 2 (two) times daily as needed (Coat Anusol  suppository with viscous lidocaine  before insertion). Patient not taking: Reported on 09/19/2023 09/10/23   Claudene Tanda POUR, PA-C  methimazole (TAPAZOLE) 5 MG tablet Take 5 mg by mouth every morning. 03/18/24   [provider]  rosuvastatin  (CRESTOR ) 20 MG tablet Take 1 tablet (20 mg total) by mouth daily. 09/05/23   Claudene Tanda POUR, PA-C    Allergies Patient has no known allergies.  No family history on file.  Social History Social History   Tobacco Use   Smoking status: Some Days    Types: Cigarettes   Smokeless tobacco: Never  Substance Use Topics   Alcohol use: Not Currently    Comment: socially   Drug use: No    Comment: uses Suboxone    Review of Systems  Constitutional: No fever/chills Eyes: No visual changes. ENT: No sore throat. Cardiovascular: Denies chest pain. Respiratory: Denies shortness of breath. Gastrointestinal: No abdominal pain.  No nausea, no vomiting.  No diarrhea.  No constipation.  GERD Genitourinary: Negative for dysuria. Musculoskeletal: Negative for back pain. Skin: Negative for rash. Neurological: Negative for headaches, focal weakness or numbness. Psychiatric: Anxiety Endocrine: Hyperlipidemia  ____________________________________________   PHYSICAL EXAM:  VITAL SIGNS: BP 134/84  Cuff Size Large  Pulse Rate 76  Temp 97.7 F (36.5 C)  Temp Source Temporal  Weight 200 lb (90.7 kg)  Height 6' 2 (1.88 m)  Resp 16  SpO2 99 %   BMI: 25.68 kg/m2  BSA: 2.18 m2   Constitutional: Alert and oriented. Well appearing and in no acute distress. Eyes: Conjunctivae are normal. PERRL. EOMI. Head: Atraumatic. Nose: No congestion/rhinnorhea. Mouth/Throat: Mucous  membranes are moist.  Oropharynx non-erythematous. Neck: No stridor. No cervical spine tenderness to palpation. Hematological/Lymphatic/Immunilogical: No cervical lymphadenopathy. Cardiovascular: Normal rate, regular rhythm. Grossly normal heart sounds.  Good peripheral circulation. Respiratory: Normal respiratory effort.  No retractions. Lungs CTAB. Gastrointestinal: Soft and nontender. No distention. No abdominal bruits. No CVA tenderness. Genitourinary: Deferred Musculoskeletal: No lower extremity tenderness nor edema.  No joint effusions. Neurologic:  Normal speech and language. No gross focal neurologic deficits are appreciated. No gait instability. Skin:  Skin is warm, dry and intact. No rash noted. Psychiatric: Mood and affect are normal. Speech and behavior are normal.  ____________________________________________   LABS           Component Ref Range & Units (hover) 6 d ago (09/03/24) 1 yr ago (08/28/23) 1 yr ago (07/01/23) 1 yr ago (07/01/23) 1 yr ago (07/01/23) 1 yr ago (11/14/22) 2 yr ago (08/08/22)  Glucose 88 100 High  100 High  CM    93  Uric Acid 7.3 7.5 CM     5.6 CM  Comment:            Therapeutic target for gout patients: <6.0  BUN 12 11 17    16   Creatinine, Ser 1.10 0.94 1.09 R    1.23  eGFR 90 109     80  BUN/Creatinine Ratio 11 12     13   Sodium 140 142 138 R    137  Potassium 4.1 4.3 3.7 R    4.1  Chloride 101 103 103 R    99  Calcium  9.6 9.4 9.2 R    9.6  Phosphorus 2.8 2.5 Low      3.5  Total Protein 7.3 7.1     7.5  Albumin 4.6 4.4     4.9  Globulin, Total 2.7 2.7     2.6  Bilirubin Total 0.6 0.8     0.5  Alkaline Phosphatase 83 93 R     83 R  LDH 206 177     189  AST 31 16     15   ALT 21 23     21   GGT 28 44     32  Iron 58 177 High      176 High   Cholesterol, Total 224 High  241 High     165 247 High   Triglycerides 148 258 High     145 191 High   HDL 53 45    53 59  VLDL Cholesterol Cal 26 47 High     25 34  LDL Chol Calc (NIH) 145 High   149 High     87 154 High   Chol/HDL Ratio 4.2 5.4 High  CM    3.1 CM 4.2 CM  Comment:                                   T. Chol/HDL Ratio  Men  Women                               1/2 Avg.Risk  3.4    3.3                                   Avg.Risk  5.0    4.4                                2X Avg.Risk  9.6    7.1                                3X Avg.Risk 23.4   11.0  Estimated CHD Risk 0.8 1.1 High  CM     0.8 CM  Comment: The CHD Risk is based on the T. Chol/HDL ratio. Other factors affect CHD Risk such as hypertension, smoking, diabetes, severe obesity, and family history of premature CHD.  TSH 0.853 0.819   0.793 R, CM  0.918  T4, Total 6.5 6.0     8.2  T3 Uptake Ratio 28 28     30   Free Thyroxine Index 1.8 1.7     2.5  WBC 5.1 5.4 CM  6.2 R   5.9  RBC 6.50 High  5.31  5.06 R   5.08  Hemoglobin 15.9 16.4  16.0 R   15.5  Hematocrit 52.0 High  49.8  46.0 R   46.5  MCV 80 94  90.9 R   92  MCH 24.5 Low  30.9  31.6 R   30.5  MCHC 30.6 Low  32.9  34.8 R   33.3  RDW 18.3 High  12.0  12.5 R   12.4  Platelets 291 266  259 R   241  Neutrophils 59 59     52  Lymphs 33 31     35  Monocytes 8 7     5   Eos 0 2     7  Basos 0 1     1  Neutrophils Absolute 3.0 3.2     3.1  Lymphocytes Absolute 1.7 1.7     2.1  Monocytes Absolute 0.4 0.4     0.3  EOS (ABSOLUTE) 0.0 0.1     0.4  Basophils Absolute 0.0 0.0     0.0  Immature Granulocytes 0 0     0  Immature Grans (Abs) 0.0 0.0     0.0                        Component Ref Range & Units (hover) 6 d ago (09/03/24) 1 yr ago (09/05/23) 1 yr ago (08/28/23) 1 yr ago (04/28/23) 2 yr ago (08/08/22) 3 yr ago (09/06/21) 3 yr ago (12/09/20)  Color, UA yellow light yellow dark yellow  yellow Yellow Yellow R  Clarity, UA clear clear clear  clear Clear   Glucose, UA Negative Negative Negative  Negative Negative   Bilirubin, UA neg neg neg  neg Negative Negative R  Ketones, UA neg neg neg  neg Negative  Negative R  Spec Grav, UA 1.015 1.010 1.015  1.010 1.025 1.025 R  Blood, UA neg neg neg  neg Negative Trace Abnormal  R  pH, UA 7.0 6.0 6.0  6.5 6.0 6.0 R  Protein, UA Positive Abnormal  Negative Positive Abnormal  CM  Negative Negative Negative R  Comment: 1+  Urobilinogen, UA 0.2 0.2 0.2  0.2 0.2   Nitrite, UA neg neg neg  neg Negative Negative R  Leukocytes, UA Negative Negative Negative  Negative Negative Negative  Appearance    CLEAR Abnormal  R  Normal   Odor      None                ____________________________________________  EKG  Normal sinus rhythm at 72 bpm.  Left atrial enlargement. ____________________________________________    ____________________________________________   INITIAL IMPRESSION / ASSESSMENT AND PLAN   As part of my medical decision making, I reviewed the following data within the electronic MEDICAL RECORD NUMBER      Findings on physical exam and EKG.  Labs continue to show elevated cholesterol.  Patient states he will go on diet and exercise instead of medication.          ____________________________________________   FINAL CLINICAL IMPRESSION Well exam   ED Discharge Orders     None        Note:  This document was prepared using Dragon voice recognition software and may include unintentional dictation errors.

## 2024-09-30 ENCOUNTER — Other Ambulatory Visit: Payer: Self-pay

## 2024-09-30 NOTE — Progress Notes (Signed)
Reconciled med list

## 2024-10-05 ENCOUNTER — Other Ambulatory Visit: Payer: Self-pay | Admitting: Physician Assistant

## 2024-10-05 DIAGNOSIS — E785 Hyperlipidemia, unspecified: Secondary | ICD-10-CM
